# Patient Record
Sex: Female | Born: 1975 | Race: White | Hispanic: No | Marital: Married | Smoking: Never smoker
Health system: Southern US, Community
[De-identification: ages and names within clinical notes are randomized; demographics above are authoritative.]

## PROBLEM LIST (undated history)

## (undated) DIAGNOSIS — D250 Submucous leiomyoma of uterus: Secondary | ICD-10-CM

## (undated) DIAGNOSIS — L03116 Cellulitis of left lower limb: Secondary | ICD-10-CM

## (undated) DIAGNOSIS — H02889 Meibomian gland dysfunction of unspecified eye, unspecified eyelid: Secondary | ICD-10-CM

## (undated) DIAGNOSIS — Z8709 Personal history of other diseases of the respiratory system: Secondary | ICD-10-CM

## (undated) DIAGNOSIS — J9811 Atelectasis: Secondary | ICD-10-CM

## (undated) DIAGNOSIS — R112 Nausea with vomiting, unspecified: Secondary | ICD-10-CM

## (undated) DIAGNOSIS — F419 Anxiety disorder, unspecified: Secondary | ICD-10-CM

## (undated) DIAGNOSIS — D509 Iron deficiency anemia, unspecified: Secondary | ICD-10-CM

## (undated) DIAGNOSIS — Z9889 Other specified postprocedural states: Secondary | ICD-10-CM

## (undated) DIAGNOSIS — Z87442 Personal history of urinary calculi: Secondary | ICD-10-CM

## (undated) DIAGNOSIS — N61 Mastitis without abscess: Secondary | ICD-10-CM

## (undated) DIAGNOSIS — C50919 Malignant neoplasm of unspecified site of unspecified female breast: Secondary | ICD-10-CM

## (undated) DIAGNOSIS — Z1379 Encounter for other screening for genetic and chromosomal anomalies: Principal | ICD-10-CM

## (undated) DIAGNOSIS — T7840XA Allergy, unspecified, initial encounter: Secondary | ICD-10-CM

## (undated) DIAGNOSIS — R221 Localized swelling, mass and lump, neck: Secondary | ICD-10-CM

## (undated) DIAGNOSIS — Z8489 Family history of other specified conditions: Secondary | ICD-10-CM

## (undated) DIAGNOSIS — G43909 Migraine, unspecified, not intractable, without status migrainosus: Secondary | ICD-10-CM

## (undated) DIAGNOSIS — I959 Hypotension, unspecified: Secondary | ICD-10-CM

## (undated) DIAGNOSIS — G709 Myoneural disorder, unspecified: Secondary | ICD-10-CM

## (undated) HISTORY — DX: Meibomian gland dysfunction of unspecified eye, unspecified eyelid: H02.889

## (undated) HISTORY — PX: DILATION AND CURETTAGE OF UTERUS: SHX78

## (undated) HISTORY — PX: TONSILLECTOMY: SUR1361

## (undated) HISTORY — PX: BREAST CYST ASPIRATION: SHX578

## (undated) HISTORY — DX: Encounter for other screening for genetic and chromosomal anomalies: Z13.79

---

## 2005-07-29 ENCOUNTER — Ambulatory Visit (HOSPITAL_COMMUNITY): Admission: RE | Admit: 2005-07-29 | Discharge: 2005-07-29 | Payer: Self-pay | Admitting: *Deleted

## 2005-07-29 ENCOUNTER — Encounter (INDEPENDENT_AMBULATORY_CARE_PROVIDER_SITE_OTHER): Payer: Self-pay | Admitting: Specialist

## 2005-09-07 ENCOUNTER — Encounter: Admission: RE | Admit: 2005-09-07 | Discharge: 2005-09-07 | Payer: Self-pay | Admitting: *Deleted

## 2005-09-10 ENCOUNTER — Encounter: Admission: RE | Admit: 2005-09-10 | Discharge: 2005-09-10 | Payer: Self-pay | Admitting: *Deleted

## 2005-09-10 ENCOUNTER — Encounter (INDEPENDENT_AMBULATORY_CARE_PROVIDER_SITE_OTHER): Payer: Self-pay | Admitting: *Deleted

## 2006-08-06 ENCOUNTER — Emergency Department (HOSPITAL_COMMUNITY): Admission: EM | Admit: 2006-08-06 | Discharge: 2006-08-06 | Payer: Self-pay | Admitting: Emergency Medicine

## 2006-10-15 ENCOUNTER — Inpatient Hospital Stay (HOSPITAL_COMMUNITY): Admission: RE | Admit: 2006-10-15 | Discharge: 2006-10-17 | Payer: Self-pay | Admitting: *Deleted

## 2007-04-26 ENCOUNTER — Inpatient Hospital Stay (HOSPITAL_COMMUNITY): Admission: AD | Admit: 2007-04-26 | Discharge: 2007-04-26 | Payer: Self-pay | Admitting: Obstetrics & Gynecology

## 2008-08-28 IMAGING — US US TRANSVAGINAL NON-OB
1 series · 13 of 25 positions shown · non-contrast
Comparison: None.

RENAL/URINARY TRACT ULTRASOUND:

CLINICAL DATA: Back pain. Dysuria.
TECHNIQUE: Complete ultrasound examination of the urinary tract was performed
including evaluation of the kidneys, renal collecting systems, and urinary
bladder.
TECHNIQUE: Both transabdominal and transvaginal ultrasound examinations of the
pelvis were performed including evaluation of the uterus, ovaries, adnexal
regions, and pelvic cul-de-sac.

[Series 1: us transvaginal non-ob · 0.26mm/px · 13 of 32 slices shown]
[im 1/32]
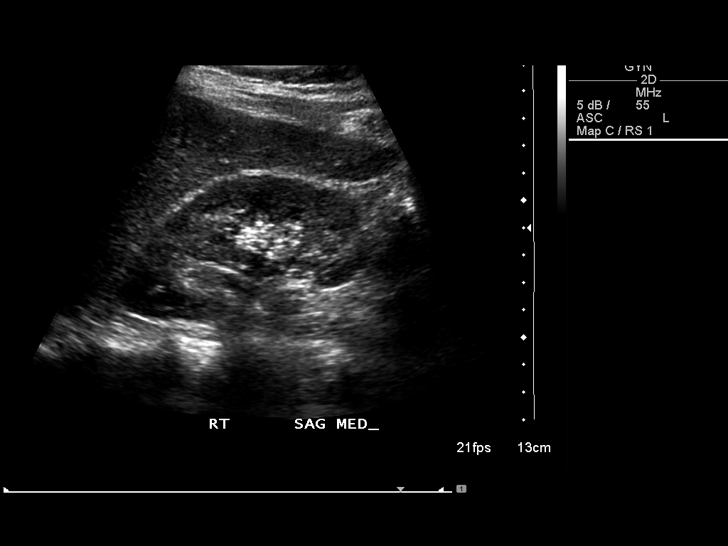
[im 3/32]
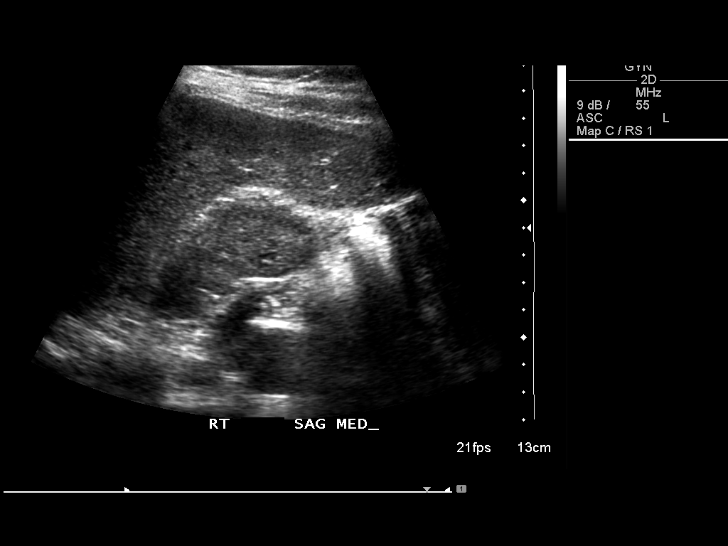
[im 6/32]
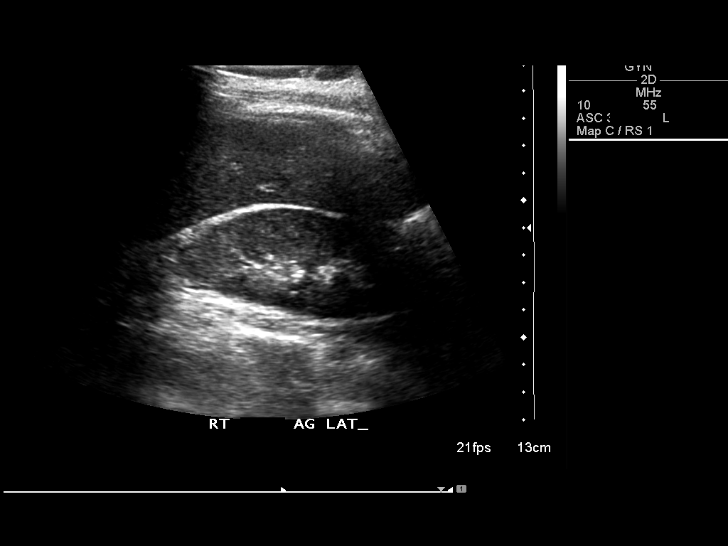
[im 8/32]
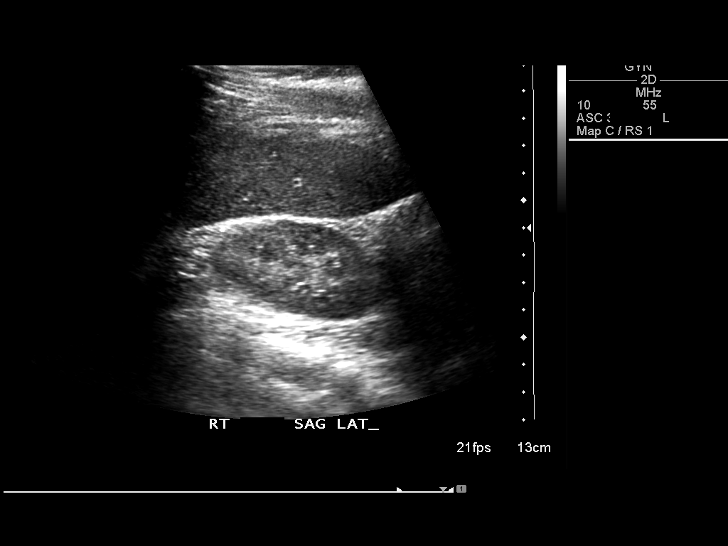
[im 11/32]
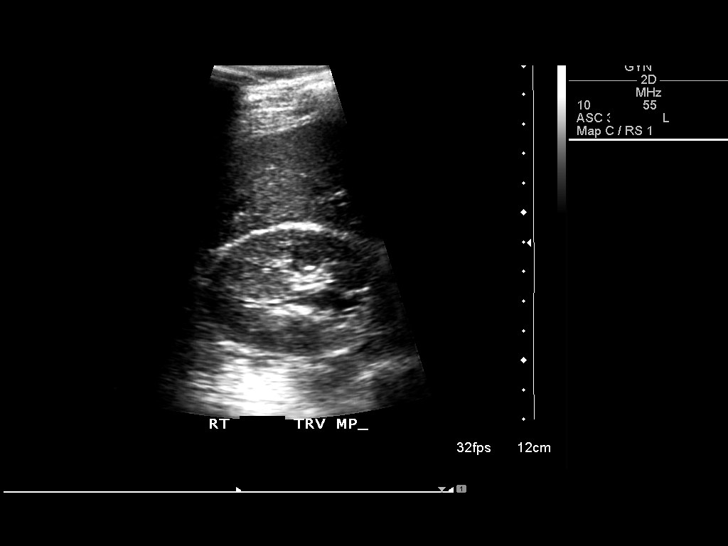
[im 13/32]
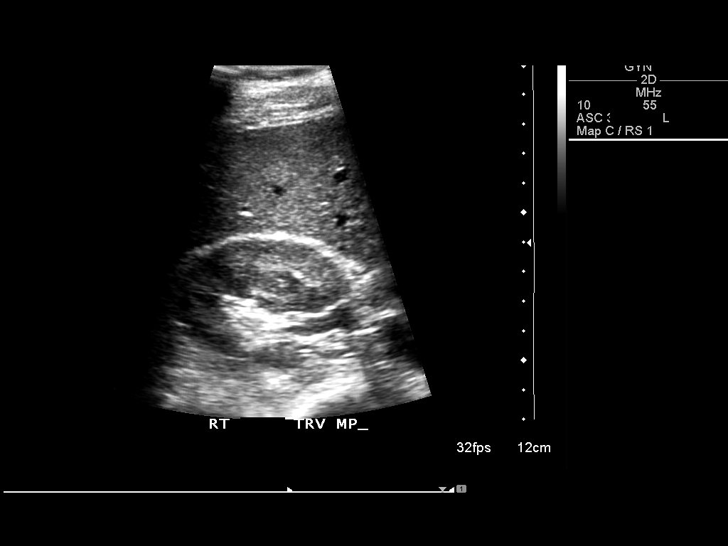
[im 16/32]
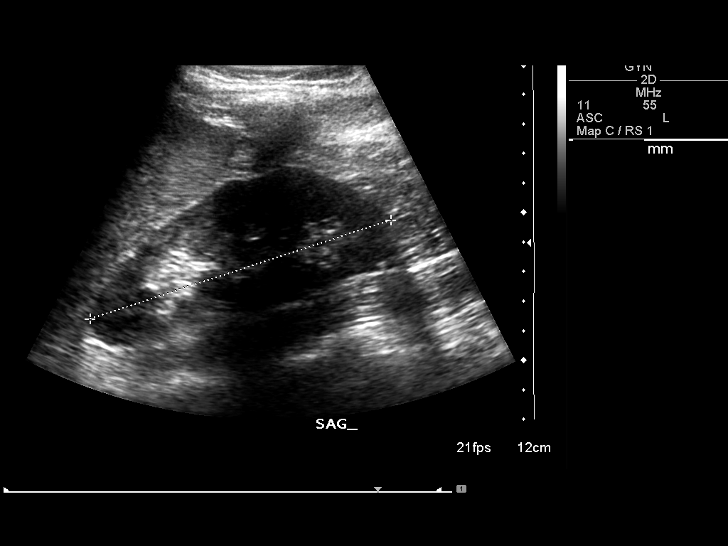
[im 19/32]
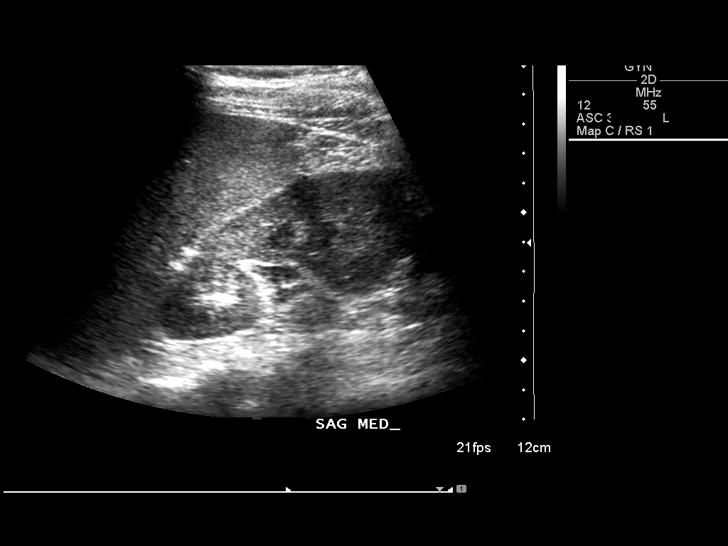
[im 21/32]
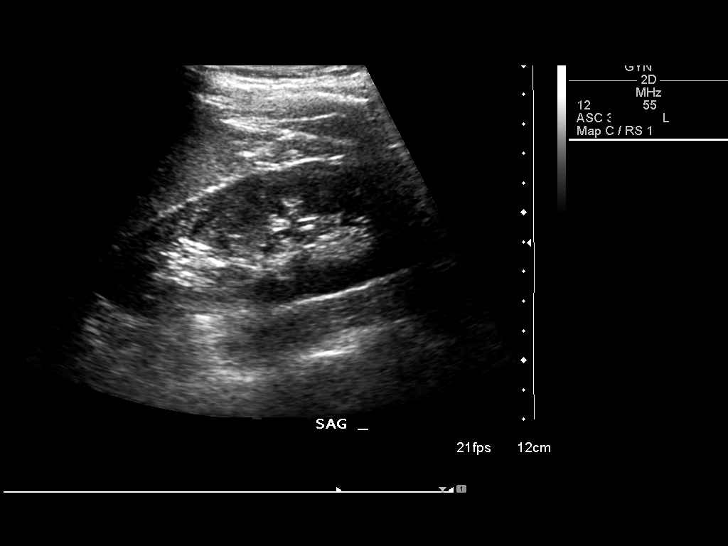
[im 24/32]
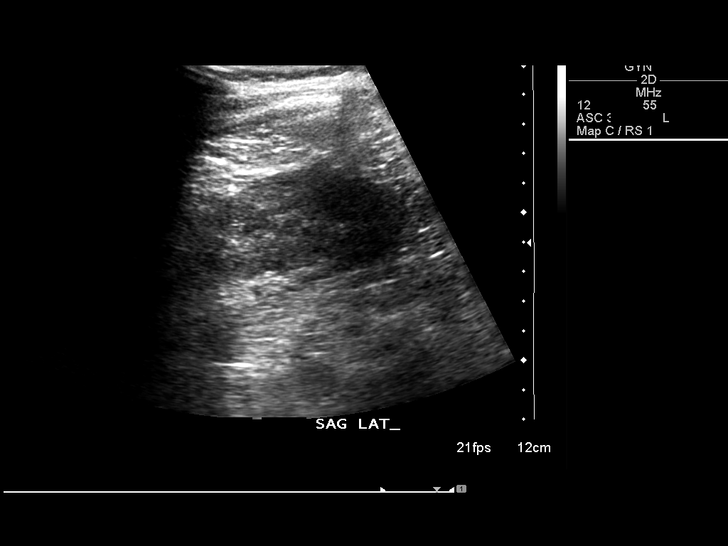
[im 26/32]
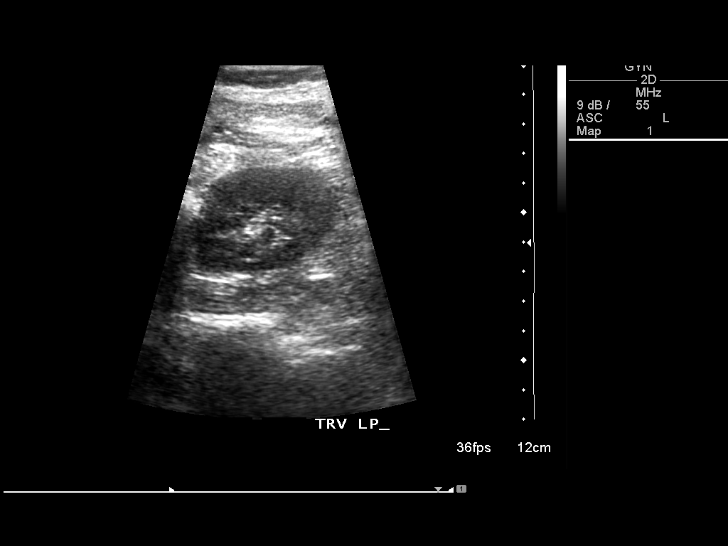
[im 29/32]
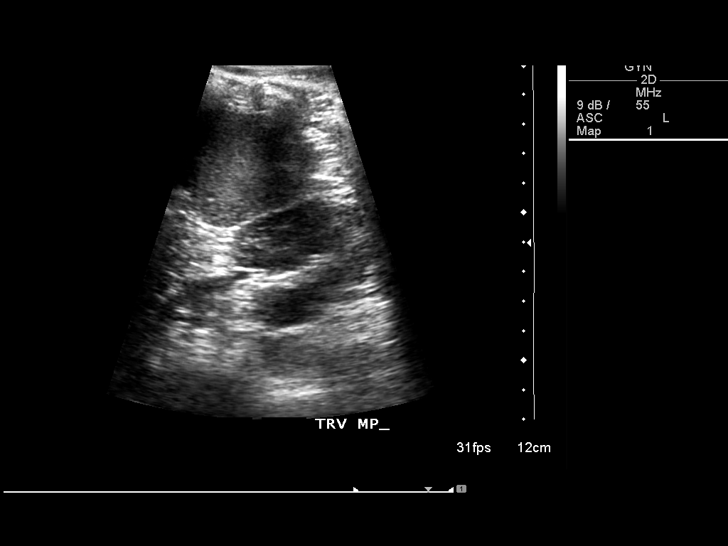
[im 32/32]
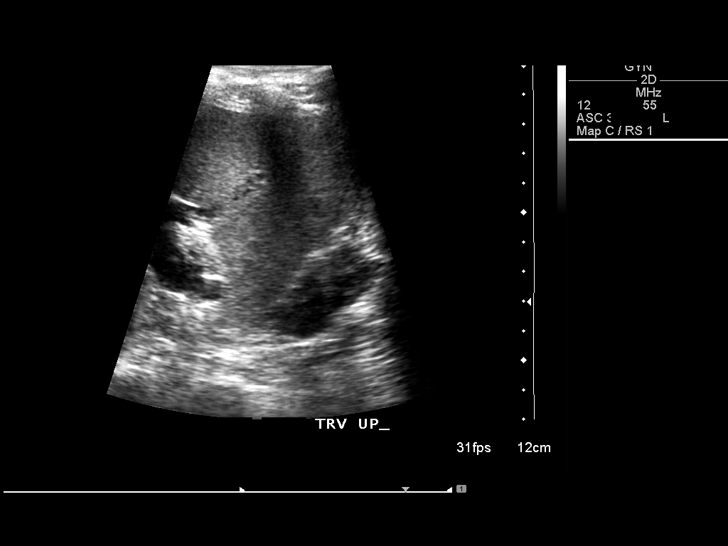

[13 of 25 positions shown; findings below may reference images not displayed]

FINDINGS: The right kidney measures 10.5 cm in long axis. The left kidney
measures 10.8 cm. Both kidneys are sonographically normal. No renal stones are
identified.

Midline imaging in the anatomic pelvis reveals a normal urinary bladder.
Bilateral ureteral jets are apparent on color Doppler imaging.
IMPRESSION: Normal renal/urinary tract ultrasound. 

TRANSABDOMINAL AND TRANSVAGINAL PELVIC ULTRASOUND:
FINDINGS: The uterus measures 6.7 x 3.2 x 4.7 cm.  Myometrial echotexture is
homogeneous. No evidence for fibroids.  Endometrial stripe thickness is 3 mm.
IUD is identified within the endometrial cavity.

The right ovary measures 3.3 x 1.8 x 1.5 cm.  The left ovary measures 3.0 x
x 2.0 cm.  Both ovaries are sonographically normal.  There is no free fluid in
the cul-de-sac.
IMPRESSION: IUD. Otherwise normal exam.

## 2008-08-28 IMAGING — US US TRANSVAGINAL NON-OB
1 series · 13 of 25 positions shown · non-contrast
Comparison: None.

RENAL/URINARY TRACT ULTRASOUND:

CLINICAL DATA: Back pain. Dysuria.
TECHNIQUE: Complete ultrasound examination of the urinary tract was performed
including evaluation of the kidneys, renal collecting systems, and urinary
bladder.
TECHNIQUE: Both transabdominal and transvaginal ultrasound examinations of the
pelvis were performed including evaluation of the uterus, ovaries, adnexal
regions, and pelvic cul-de-sac.

[Series 1: us transvaginal non-ob · 0.16mm/px · 27 acquisitions, 13 frames shown]
[im 1/27]
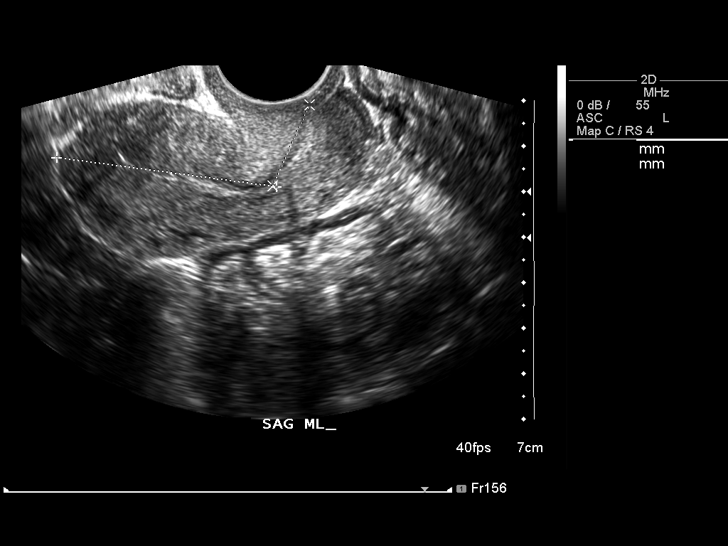
[im 3/27]
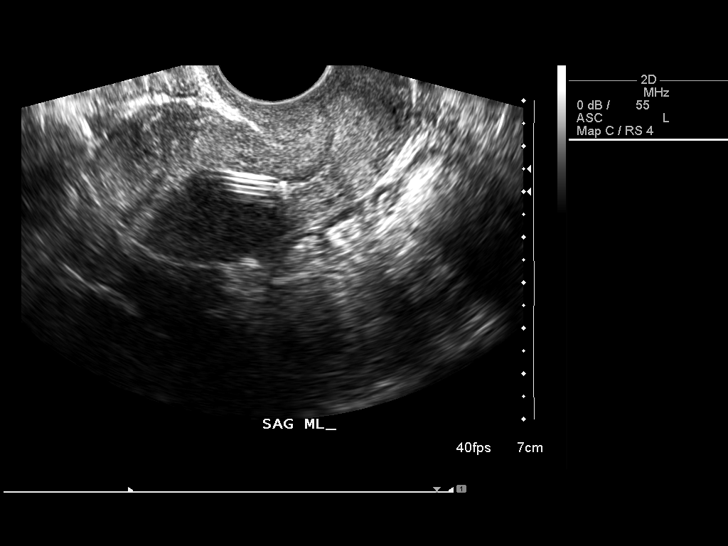
[im 5/27]
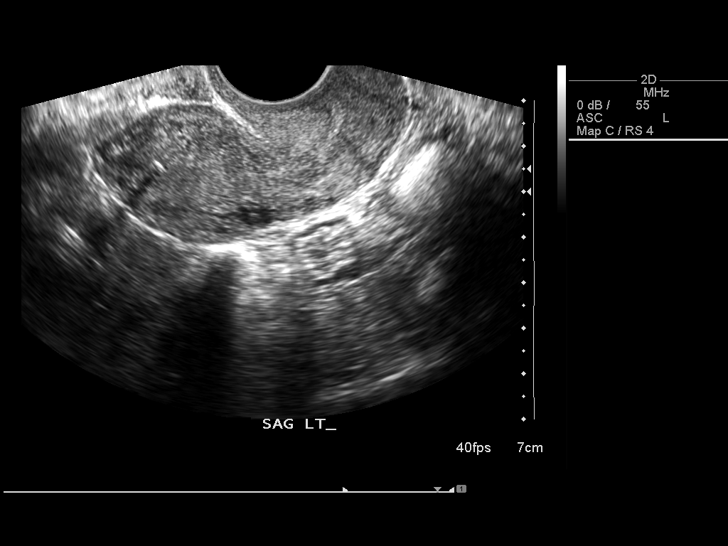
[im 7/27]
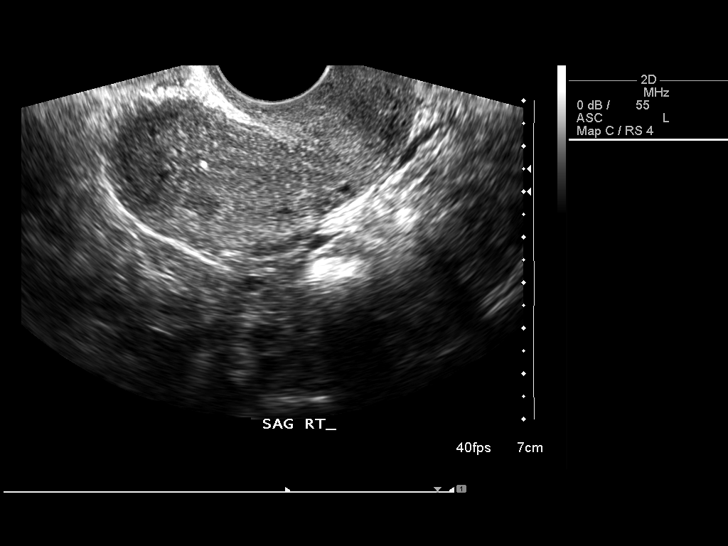
[im 9/27]
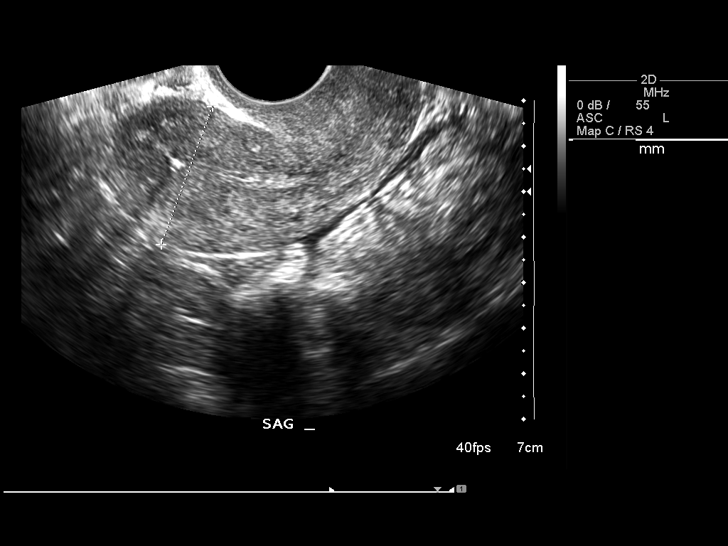
[im 11/27]
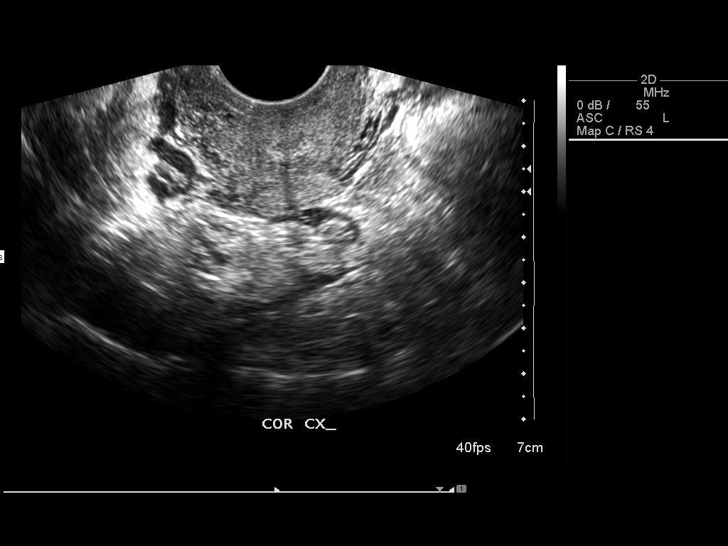
[im 14/27]
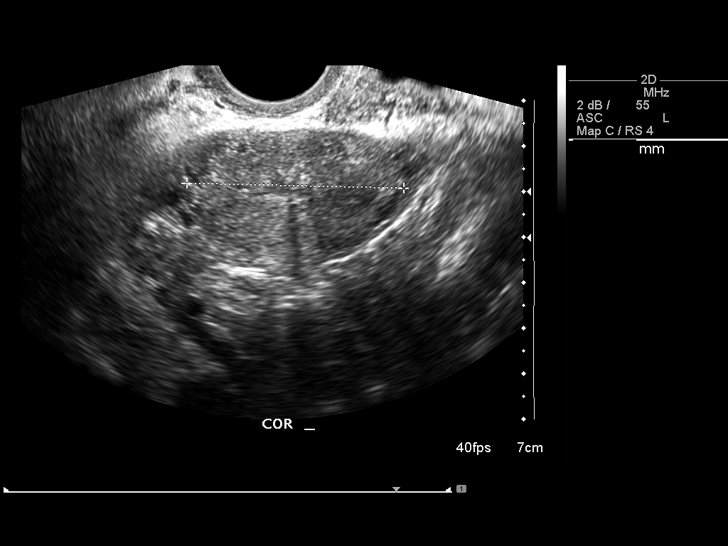
[im 16/27]
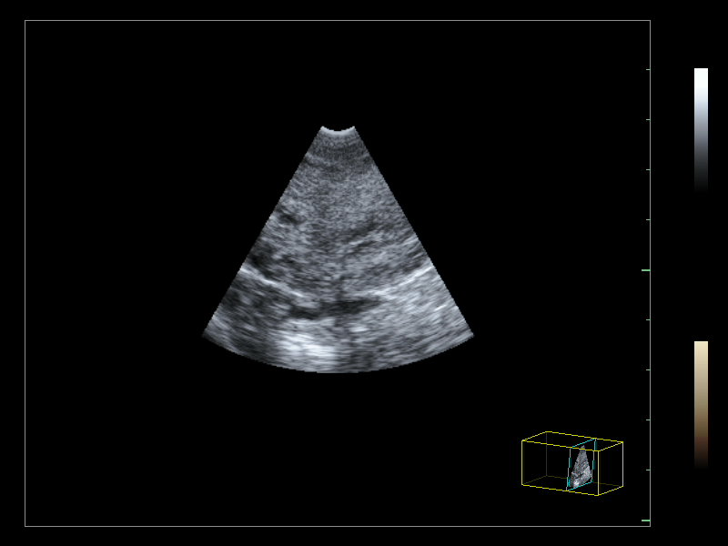
[im 18/27]
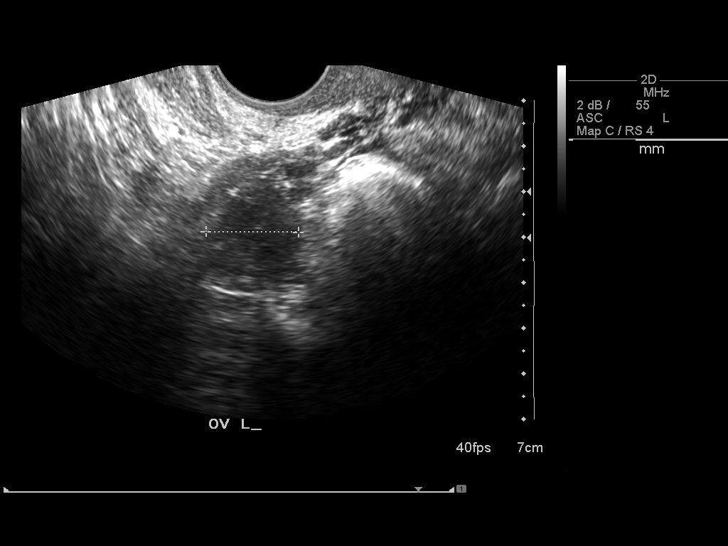
[im 20/27]
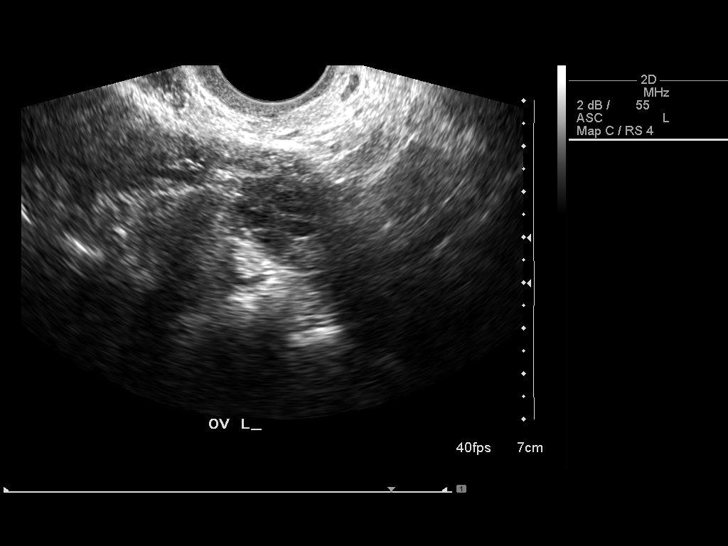
[im 22/27]
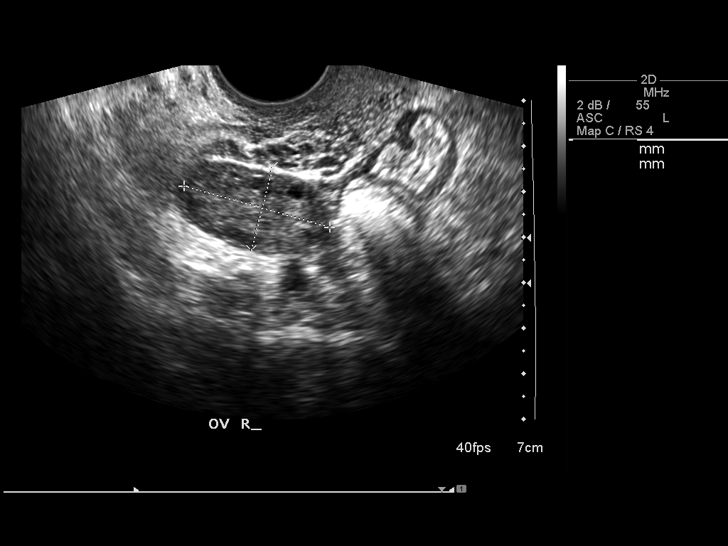
[im 24/27]
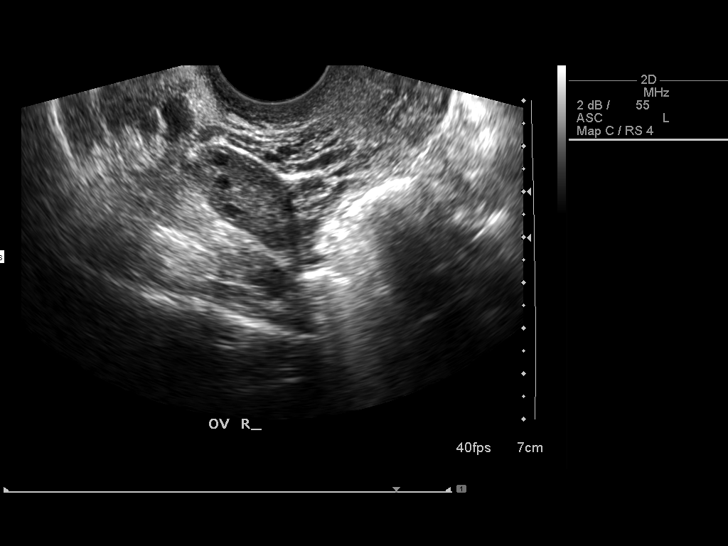
[im 27/27]
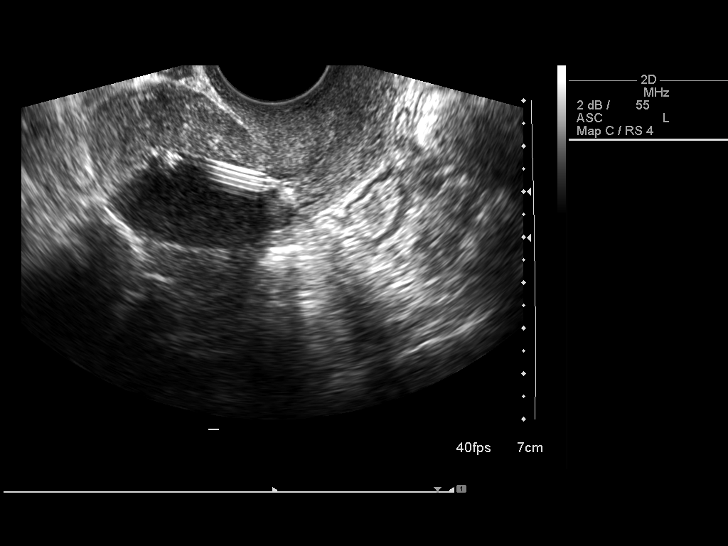

[13 of 25 positions shown; findings below may reference images not displayed]

FINDINGS: The right kidney measures 10.5 cm in long axis. The left kidney
measures 10.8 cm. Both kidneys are sonographically normal. No renal stones are
identified.

Midline imaging in the anatomic pelvis reveals a normal urinary bladder.
Bilateral ureteral jets are apparent on color Doppler imaging.
IMPRESSION: Normal renal/urinary tract ultrasound. 

TRANSABDOMINAL AND TRANSVAGINAL PELVIC ULTRASOUND:
FINDINGS: The uterus measures 6.7 x 3.2 x 4.7 cm.  Myometrial echotexture is
homogeneous. No evidence for fibroids.  Endometrial stripe thickness is 3 mm.
IUD is identified within the endometrial cavity.

The right ovary measures 3.3 x 1.8 x 1.5 cm.  The left ovary measures 3.0 x
x 2.0 cm.  Both ovaries are sonographically normal.  There is no free fluid in
the cul-de-sac.
IMPRESSION: IUD. Otherwise normal exam.

## 2010-11-18 ENCOUNTER — Encounter
Admission: RE | Admit: 2010-11-18 | Discharge: 2010-11-18 | Payer: Self-pay | Source: Home / Self Care | Attending: Obstetrics & Gynecology | Admitting: Obstetrics & Gynecology

## 2010-12-01 ENCOUNTER — Encounter
Admission: RE | Admit: 2010-12-01 | Discharge: 2010-12-01 | Payer: Self-pay | Source: Home / Self Care | Attending: Obstetrics & Gynecology | Admitting: Obstetrics & Gynecology

## 2011-04-10 NOTE — Op Note (Signed)
NAMEALAZAE, CRYMES           ACCOUNT NO.:  [id number]   MEDICAL RECORD NO.:  [id number]          PATIENT TYPE:  AMB   LOCATION:  [facility]                           FACILITY:  [facility]   PHYSICIAN:  [place] B. Earlene Plater, M.D.  DATE OF BIRTH:  Mar 23, 1976   DATE OF PROCEDURE:  07/29/2005  DATE OF DISCHARGE:                                 OPERATIVE REPORT   PREOPERATIVE DIAGNOSIS:  An 8-week missed abortion.   POSTOPERATIVE DIAGNOSIS:  An 8-week missed abortion.   PROCEDURE:  Suction curettage.   SURGEON:  Chester Holstein. Earlene Plater, M.D.   ASSISTANTS:  None.   ANESTHESIA:  MAC and 20 cubic centimeters of 1% lidocaine paracervical  block.   SPECIMENS:  Products of conception.   BLOOD LOSS:  Minimal.   COMPLICATIONS:  None.   INDICATIONS:  The patient presented to the office with 3-4 day history of  light vaginal bleeding without pain.  Ultrasound confirmed an 8 week demise.  Options of expectant management versus operative management were discussed.  Risks of surgery were discussed including infection, bleeding, uterine  perforation, and damage to surrounding organs.   On the morning of surgery, patient did note some increase in bleeding and  cramping and did pass some tissue although her bleeding continued and  patient stated it was quite heavy in the preoperative area.  I therefore  decided to proceed this morning with suction and curettage.   PROCEDURE:  The patient was taken to the operating room and MAC anesthesia  obtained.  She was placed in the [place] stirrups and prepped and draped in  the standard fashion.  Bladder was emptied with in and out catheterization.  Exam under anesthesia showed an 8 week anteverted uterus, no adnexal masses.   Speculum was inserted, paracervical block placed.  A single-toothed  tenaculum was attached to the anterior lip of the cervix.  The cervix was  easily dilated to a #21 and the #7 suction cannula inserted.  It was  suctioned with only a small amount  of products returned.  The uterus was  cleared with suction.  The endometrium was then gently curetted and a gritty  texture noted throughout with no additional tissue return.  Suction cannula  was passed one more time and no products returned.  Therefore, the procedure  was terminated.   Instruments were removed and cervix hemostatic.   The patient tolerated the procedure well and there were no known  complications.  She was taken to the recovery room in awake, alert, and  stable condition.      Gerri Spore B. Earlene Plater, M.D.  Electronically Signed     WBD/MEDQ  D:  07/29/2005  T:  07/29/2005  Job:  [id number]

## 2011-09-10 LAB — URINALYSIS, ROUTINE W REFLEX MICROSCOPIC
Bilirubin Urine: NEGATIVE
Glucose, UA: NEGATIVE
Ketones, ur: NEGATIVE
Nitrite: NEGATIVE
Protein, ur: NEGATIVE
Specific Gravity, Urine: <1.005 — ABNORMAL LOW
Urobilinogen, UA: 0.2
pH: 7

## 2011-09-10 LAB — DIFFERENTIAL
Basophils Absolute: 0
Basophils Relative: 0
Eosinophils Absolute: 0
Eosinophils Relative: 0
Lymphocytes Relative: 12
Lymphs Abs: 1.6
Monocytes Absolute: 0.7
Monocytes Relative: 5
Neutro Abs: 11.7 — ABNORMAL HIGH
Neutrophils Relative %: 83 — ABNORMAL HIGH

## 2011-09-10 LAB — URINE MICROSCOPIC-ADD ON

## 2011-09-10 LAB — URINE CULTURE: Colony Count: >=100000

## 2011-09-10 LAB — CBC
HCT: 41.9
Hemoglobin: 14.1
MCHC: 33.6
MCV: 86.3
Platelets: 214
RBC: 4.86
RDW: 14.6 — ABNORMAL HIGH
WBC: 14 — ABNORMAL HIGH

## 2011-09-10 LAB — POCT PREGNANCY, URINE
Operator id: 120561
Preg Test, Ur: NEGATIVE

## 2012-04-23 DIAGNOSIS — L03116 Cellulitis of left lower limb: Secondary | ICD-10-CM

## 2012-04-23 HISTORY — DX: Cellulitis of left lower limb: L03.116

## 2013-08-23 DIAGNOSIS — Z8709 Personal history of other diseases of the respiratory system: Secondary | ICD-10-CM

## 2013-08-23 HISTORY — DX: Personal history of other diseases of the respiratory system: Z87.09

## 2014-05-14 ENCOUNTER — Other Ambulatory Visit: Payer: Self-pay

## 2014-05-14 DIAGNOSIS — Z1231 Encounter for screening mammogram for malignant neoplasm of breast: Secondary | ICD-10-CM

## 2014-06-26 ENCOUNTER — Ambulatory Visit
Admission: RE | Admit: 2014-06-26 | Discharge: 2014-06-26 | Disposition: A | Discharge status: Home / Self Care | Payer: BC Managed Care – PPO | Source: Ambulatory Visit

## 2014-06-26 ENCOUNTER — Encounter (INDEPENDENT_AMBULATORY_CARE_PROVIDER_SITE_OTHER): Payer: Self-pay

## 2014-06-26 DIAGNOSIS — Z1231 Encounter for screening mammogram for malignant neoplasm of breast: Secondary | ICD-10-CM

## 2015-05-22 ENCOUNTER — Other Ambulatory Visit: Payer: Self-pay

## 2015-05-22 DIAGNOSIS — Z1231 Encounter for screening mammogram for malignant neoplasm of breast: Secondary | ICD-10-CM

## 2015-06-28 ENCOUNTER — Ambulatory Visit
Admission: RE | Admit: 2015-06-28 | Discharge: 2015-06-28 | Disposition: A | Discharge status: Home / Self Care | Payer: BLUE CROSS/BLUE SHIELD | Source: Ambulatory Visit

## 2015-06-28 DIAGNOSIS — Z1231 Encounter for screening mammogram for malignant neoplasm of breast: Secondary | ICD-10-CM

## 2016-09-29 ENCOUNTER — Other Ambulatory Visit: Payer: Self-pay | Admitting: Obstetrics & Gynecology

## 2016-09-29 DIAGNOSIS — Z1231 Encounter for screening mammogram for malignant neoplasm of breast: Secondary | ICD-10-CM

## 2016-10-30 ENCOUNTER — Ambulatory Visit
Admission: RE | Admit: 2016-10-30 | Discharge: 2016-10-30 | Disposition: A | Discharge status: Home / Self Care | Payer: BLUE CROSS/BLUE SHIELD | Source: Ambulatory Visit | Attending: Obstetrics & Gynecology | Admitting: Obstetrics & Gynecology

## 2016-10-30 DIAGNOSIS — Z1231 Encounter for screening mammogram for malignant neoplasm of breast: Secondary | ICD-10-CM

## 2016-11-04 ENCOUNTER — Other Ambulatory Visit: Payer: Self-pay | Admitting: Obstetrics & Gynecology

## 2016-11-04 DIAGNOSIS — R928 Other abnormal and inconclusive findings on diagnostic imaging of breast: Secondary | ICD-10-CM

## 2016-11-11 ENCOUNTER — Ambulatory Visit
Admission: RE | Admit: 2016-11-11 | Discharge: 2016-11-11 | Disposition: A | Discharge status: Home / Self Care | Payer: BLUE CROSS/BLUE SHIELD | Source: Ambulatory Visit | Attending: Obstetrics & Gynecology | Admitting: Obstetrics & Gynecology

## 2016-11-11 DIAGNOSIS — R928 Other abnormal and inconclusive findings on diagnostic imaging of breast: Secondary | ICD-10-CM

## 2017-03-24 ENCOUNTER — Encounter: Payer: Self-pay | Admitting: Obstetrics & Gynecology

## 2017-03-31 ENCOUNTER — Ambulatory Visit (INDEPENDENT_AMBULATORY_CARE_PROVIDER_SITE_OTHER): Payer: BLUE CROSS/BLUE SHIELD | Admitting: Obstetrics & Gynecology

## 2017-03-31 ENCOUNTER — Telehealth: Payer: Self-pay | Admitting: *Deleted

## 2017-03-31 ENCOUNTER — Encounter: Payer: Self-pay | Admitting: Obstetrics & Gynecology

## 2017-03-31 VITALS — BP 126/80 | Ht 66.0 in | Wt 145.0 lb

## 2017-03-31 DIAGNOSIS — N63 Unspecified lump in unspecified breast: Secondary | ICD-10-CM | POA: Diagnosis not present

## 2017-03-31 DIAGNOSIS — Z01411 Encounter for gynecological examination (general) (routine) with abnormal findings: Secondary | ICD-10-CM | POA: Diagnosis not present

## 2017-03-31 DIAGNOSIS — Z9852 Vasectomy status: Secondary | ICD-10-CM

## 2017-03-31 NOTE — Progress Notes (Signed)
Carla Velez November 21, 1976 [id number]   History:    41 y.o. G2P2 married.  2 girls 43 and 43 yo doing well.  Vasectomy.  RP:  Established patient presenting for annual gyn exam   Menses reg normal qmonth, normal flow.  No pelvic pain.  No vaginal d/c.  Breasts wnl.  Had a Rt Dx mammo/US last year which came back negative.  Mother with h/o Breast Ca.  Past medical history,surgical history, family history and social history were all reviewed and documented in the EPIC chart.  Gynecologic History Patient's last menstrual period was 03/21/2017. Contraception: vasectomy Last Pap: 2017. Results were: normal per patient Last mammogram: 10/2016. Results were:  Lt screening normal. Rt Dx mammo/US neg.  Obstetric History OB History  Gravida Para Term Preterm AB Living  2 1   1 1 2   SAB TAB Ectopic Multiple Live Births      0        # Outcome Date GA Lbr Len/2nd Weight Sex Delivery Anes PTL Lv  2 Preterm           1 AB                ROS: A ROS was performed and pertinent positives and negatives are included in the history.  GENERAL: No fevers or chills. HEENT: No change in vision, no earache, sore throat or sinus congestion. NECK: No pain or stiffness. CARDIOVASCULAR: No chest pain or pressure. No palpitations. PULMONARY: No shortness of breath, cough or wheeze. GASTROINTESTINAL: No abdominal pain, nausea, vomiting or diarrhea, melena or bright red blood per rectum. GENITOURINARY: No urinary frequency, urgency, hesitancy or dysuria. MUSCULOSKELETAL: No joint or muscle pain, no back pain, no recent trauma. DERMATOLOGIC: No rash, no itching, no lesions. ENDOCRINE: No polyuria, polydipsia, no heat or cold intolerance. No recent change in weight. HEMATOLOGICAL: No anemia or easy bruising or bleeding. NEUROLOGIC: No headache, seizures, numbness, tingling or weakness. PSYCHIATRIC: No depression, no loss of interest in normal activity or change in sleep pattern.     Exam:   BP 126/80   Ht  5\' 6"  (1.676 m)   Wt 145 lb (65.8 kg)   LMP 03/21/2017   BMI 23.40 kg/m   Body mass index is 23.4 kg/m.  General appearance : Well developed well nourished female. No acute distress HEENT: Eyes: no retinal hemorrhage or exudates,  Neck supple, trachea midline, no carotid bruits, no thyroidmegaly Lungs: Clear to auscultation, no rhonchi or wheezes, or rib retractions  Heart: Regular rate and rhythm, no murmurs or gallops Breast:Examined in sitting and supine position were symmetrical in appearance.  Left: no palpable masses or tenderness,  no skin retraction, no nipple inversion, no nipple discharge, no skin discoloration.  Right: 1 cm x 1 cm nodule at 1 O'clock close to [place].  Mobile, not tender, skin normal.  No axillary or supraclavicular lymphadenopathy bilaterally. Abdomen: no palpable masses or tenderness, no rebound or guarding Extremities: no edema or skin discoloration or tenderness  Pelvic:  [place], Urethra, Skene Glands: Within normal limits             Vagina: No gross lesions or discharge  Cervix: No gross lesions or discharge.  Pap/HPV done.  Uterus  AV, normal size, shape and consistency, non-tender and mobile  Adnexa  Without masses or tenderness  Anus and perineum  normal    Assessment/Plan:  41 y.o. female for annual exam   1. Encounter for gynecological examination with abnormal finding Normal gyn  exam except Rt breast nodule.  Pap/HPV done.   2. Breast nodule Rt breast nodule at 1 O'clock.  Rt Dx Mammo/US at Breast center.  Mother with Breast Ca.  Findings and plan discussed with patient.  3. Vasectomy status/Husband  Counseling >50% 10 min on above issue.  [name] MD, 10:19 AM 03/31/2017

## 2017-03-31 NOTE — Telephone Encounter (Signed)
Pt scheduled on 04/05/17 @ 2:30 pm at breast center,pt aware.

## 2017-03-31 NOTE — Patient Instructions (Signed)
1. Encounter for gynecological examination with abnormal finding Normal gyn exam except Rt breast nodule.  Pap/HPV done.   2. Breast nodule Rt breast nodule at 1 O'clock.  Rt Dx Mammo/US at Breast center.  Mother with Breast Ca.  Findings and plan discussed with patient.  3. Vasectomy status/Husband  Giuliana, it was a pleasure to see you today!  You will receive a phone call to schedule your Diagnostic right mammo and breast US.

## 2017-03-31 NOTE — Telephone Encounter (Signed)
-----   Message from [name], MD sent at 03/31/2017 10:52 AM EDT ----- Regarding: Rt Dx mammo/Breast US Rt breast nodule at 1 O'clock close to [place] 1 x 1 cm mobile, solid, non-tender.  Mother with Breast Ca.

## 2017-04-02 LAB — PAP, TP IMAGING W/ HPV RNA, RFLX HPV TYPE 16,18/45: HPV mRNA, High Risk: NOT DETECTED

## 2017-04-05 ENCOUNTER — Ambulatory Visit
Admission: RE | Admit: 2017-04-05 | Discharge: 2017-04-05 | Disposition: A | Discharge status: Home / Self Care | Payer: BLUE CROSS/BLUE SHIELD | Source: Ambulatory Visit | Attending: Obstetrics & Gynecology | Admitting: Obstetrics & Gynecology

## 2017-04-05 ENCOUNTER — Other Ambulatory Visit: Payer: Self-pay | Admitting: Obstetrics & Gynecology

## 2017-04-05 DIAGNOSIS — N63 Unspecified lump in unspecified breast: Secondary | ICD-10-CM

## 2017-04-05 DIAGNOSIS — N6310 Unspecified lump in the right breast, unspecified quadrant: Secondary | ICD-10-CM

## 2017-04-07 ENCOUNTER — Ambulatory Visit
Admission: RE | Admit: 2017-04-07 | Discharge: 2017-04-07 | Disposition: A | Discharge status: Home / Self Care | Payer: BLUE CROSS/BLUE SHIELD | Source: Ambulatory Visit | Attending: Obstetrics & Gynecology | Admitting: Obstetrics & Gynecology

## 2017-04-07 ENCOUNTER — Other Ambulatory Visit: Payer: Self-pay | Admitting: Obstetrics & Gynecology

## 2017-04-07 DIAGNOSIS — C50919 Malignant neoplasm of unspecified site of unspecified female breast: Secondary | ICD-10-CM

## 2017-04-07 DIAGNOSIS — N6310 Unspecified lump in the right breast, unspecified quadrant: Secondary | ICD-10-CM

## 2017-04-07 HISTORY — DX: Malignant neoplasm of unspecified site of unspecified female breast: C50.919

## 2017-04-08 ENCOUNTER — Telehealth: Payer: Self-pay | Admitting: *Deleted

## 2017-04-08 NOTE — Telephone Encounter (Signed)
Confirmed [facility] for 04/14/17 at 815am .  Instructions and contact information given.

## 2017-04-13 ENCOUNTER — Other Ambulatory Visit: Payer: Self-pay | Admitting: *Deleted

## 2017-04-13 DIAGNOSIS — Z170 Estrogen receptor positive status [ER+]: Principal | ICD-10-CM

## 2017-04-13 DIAGNOSIS — C50211 Malignant neoplasm of upper-inner quadrant of right female breast: Secondary | ICD-10-CM | POA: Insufficient documentation

## 2017-04-14 ENCOUNTER — Encounter: Payer: Self-pay | Admitting: Hematology and Oncology

## 2017-04-14 ENCOUNTER — Encounter: Payer: Self-pay | Admitting: Physical Therapy

## 2017-04-14 ENCOUNTER — Encounter: Payer: Self-pay | Admitting: *Deleted

## 2017-04-14 ENCOUNTER — Ambulatory Visit (HOSPITAL_BASED_OUTPATIENT_CLINIC_OR_DEPARTMENT_OTHER): Payer: BLUE CROSS/BLUE SHIELD | Admitting: Hematology and Oncology

## 2017-04-14 ENCOUNTER — Ambulatory Visit: Payer: Self-pay | Admitting: Surgery

## 2017-04-14 ENCOUNTER — Encounter: Payer: Self-pay | Admitting: Radiation Oncology

## 2017-04-14 ENCOUNTER — Ambulatory Visit
Admission: RE | Admit: 2017-04-14 | Discharge: 2017-04-14 | Disposition: A | Discharge status: Home / Self Care | Payer: BLUE CROSS/BLUE SHIELD | Source: Ambulatory Visit | Attending: Radiation Oncology | Admitting: Radiation Oncology

## 2017-04-14 ENCOUNTER — Other Ambulatory Visit (HOSPITAL_BASED_OUTPATIENT_CLINIC_OR_DEPARTMENT_OTHER): Payer: BLUE CROSS/BLUE SHIELD

## 2017-04-14 ENCOUNTER — Ambulatory Visit: Payer: BLUE CROSS/BLUE SHIELD | Attending: Surgery | Admitting: Physical Therapy

## 2017-04-14 DIAGNOSIS — Z170 Estrogen receptor positive status [ER+]: Principal | ICD-10-CM

## 2017-04-14 DIAGNOSIS — C50211 Malignant neoplasm of upper-inner quadrant of right female breast: Secondary | ICD-10-CM

## 2017-04-14 DIAGNOSIS — F419 Anxiety disorder, unspecified: Secondary | ICD-10-CM | POA: Insufficient documentation

## 2017-04-14 DIAGNOSIS — R293 Abnormal posture: Secondary | ICD-10-CM | POA: Diagnosis present

## 2017-04-14 DIAGNOSIS — G709 Myoneural disorder, unspecified: Secondary | ICD-10-CM | POA: Insufficient documentation

## 2017-04-14 DIAGNOSIS — Z87442 Personal history of urinary calculi: Secondary | ICD-10-CM | POA: Insufficient documentation

## 2017-04-14 DIAGNOSIS — Z9889 Other specified postprocedural states: Secondary | ICD-10-CM | POA: Insufficient documentation

## 2017-04-14 DIAGNOSIS — C50411 Malignant neoplasm of upper-outer quadrant of right female breast: Secondary | ICD-10-CM

## 2017-04-14 DIAGNOSIS — Z803 Family history of malignant neoplasm of breast: Secondary | ICD-10-CM

## 2017-04-14 LAB — COMPREHENSIVE METABOLIC PANEL
ALBUMIN: 4.1 g/dL (ref 3.5–5.0)
ALK PHOS: 65 U/L (ref 40–150)
ALT: 14 U/L (ref 0–55)
AST: 17 U/L (ref 5–34)
Anion Gap: 10 mEq/L (ref 3–11)
BUN: 12.9 mg/dL (ref 7.0–26.0)
CO2: 25 meq/L (ref 22–29)
Calcium: 9.3 mg/dL (ref 8.4–10.4)
Chloride: 109 mEq/L (ref 98–109)
Creatinine: 0.8 mg/dL (ref 0.6–1.1)
GLUCOSE: 75 mg/dL (ref 70–140)
Potassium: 4.1 mEq/L (ref 3.5–5.1)
SODIUM: 144 meq/L (ref 136–145)
Total Bilirubin: 0.68 mg/dL (ref 0.20–1.20)
Total Protein: 6.8 g/dL (ref 6.4–8.3)

## 2017-04-14 LAB — CBC WITH DIFFERENTIAL/PLATELET
BASO%: 0.3 % (ref 0.0–2.0)
BASOS ABS: 0 10*3/uL (ref 0.0–0.1)
EOS ABS: 0.2 10*3/uL (ref 0.0–0.5)
EOS%: 2.3 % (ref 0.0–7.0)
HCT: 41 % (ref 34.8–46.6)
HGB: 13.4 g/dL (ref 11.6–15.9)
LYMPH%: 26.4 % (ref 14.0–49.7)
MCH: 29.2 pg (ref 25.1–34.0)
MCHC: 32.7 g/dL (ref 31.5–36.0)
MCV: 89.3 fL (ref 79.5–101.0)
MONO#: 0.7 10*3/uL (ref 0.1–0.9)
MONO%: 9.1 % (ref 0.0–14.0)
NEUT#: 4.5 10*3/uL (ref 1.5–6.5)
NEUT%: 61.9 % (ref 38.4–76.8)
Platelets: 185 10*3/uL (ref 145–400)
RBC: 4.59 10*6/uL (ref 3.70–5.45)
RDW: 13.1 % (ref 11.2–14.5)
WBC: 7.3 10*3/uL (ref 3.9–10.3)
lymph#: 1.9 10*3/uL (ref 0.9–3.3)

## 2017-04-14 MED ORDER — TAMOXIFEN CITRATE 20 MG PO TABS: 20.0000 mg | ORAL_TABLET | Freq: Every day | ORAL | 0 refills | Status: DC

## 2017-04-14 NOTE — Progress Notes (Signed)
Radiation Oncology         [phone] ________________________________  Name: Carla Velez MRN: [id number]  Date: 04/14/2017  DOB: 07-14-1976  LE:[id number], No Pcp Per  Erroll Luna, MD     REFERRING PHYSICIAN: Erroll Luna, MD   DIAGNOSIS: The encounter diagnosis was Malignant neoplasm of upper-inner quadrant of right breast in female, estrogen receptor positive ([place]).   HISTORY OF PRESENT ILLNESS:  Carla Velez is a 41 y.o. female seen in multidisciplinary breast clinic for a newly diagnosed right breast cancer. She presented to her gynecologist for routine PE on 03/31/17 and was found to have a 1 x 1 cm palpable mass in the upper inner quadrant of the right breast near the areola.  Diagnostic mammography on 04/05/17 revealed no definite focal abnormality over the inner upper right periareolar region to correspond to the patient's palpable abnormality. Ultrasound was performed that same day and revealed a hypoechoic mass with irregular shape and margins at the 1:30 position, 2 cm from the nipple, corresponding to the patient's palpable abnormality and measuring 1.2 x 1.3 x 2.0 cm. U/S of the right axilla was negative.  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 Hormone receptor status is ER+ (95%), PR+ (90%), HER-2 (negative) and Ki67 (10%). She comes today to review the treatment options and recommendations for her newly diagnosed breast cancer.  PREVIOUS RADIATION THERAPY: No   PAST MEDICAL HISTORY:  Past Medical History:  Diagnosis Date  . Meibomian gland dysfunction        PAST SURGICAL HISTORY: Past Surgical History:  Procedure Laterality Date  . BREAST CYST ASPIRATION    . DILATION AND CURETTAGE OF UTERUS    . TONSILLECTOMY       FAMILY HISTORY:  Family History  Problem Relation Age of Onset  . Breast cancer Mother 17  . Heart disease Father   . Kidney failure Father   . Macrosomia Maternal Grandmother   . Macular degeneration  Maternal Grandmother   . Arthritis Maternal Grandmother   . Heart disease Maternal Grandfather   . Kidney disease Paternal Grandfather      SOCIAL HISTORY:  reports that she has never smoked. She has never used smokeless tobacco. She reports that she drinks alcohol. She reports that she does not use drugs. She is married and lives in [place] with her husband and two daughters.  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   ALLERGIES: Erythromycin and Silicone   MEDICATIONS:  Current Outpatient Prescriptions  Medication Sig Dispense Refill  . ARTIFICIAL TEAR OP Apply to eye as needed.    . Cod Liver Oil 5000-500 UNIT/5ML OIL Take 1 tablet by mouth daily.    . fexofenadine (ALLEGRA) 30 MG tablet Take by mouth as needed.    . loratadine (CLARITIN) 10 MG tablet Take 10 mg by mouth daily as needed for allergies.    . Multiple Vitamin (MULTIVITAMIN) capsule Take 1 capsule by mouth daily.     No current facility-administered medications for this encounter.      REVIEW OF SYSTEMS: On review of systems, the patient reports that she is doing well overall. She reports positive for night sweats, loss of sleep, muscle aching pain to the neck, shoulders, and lower back, breast lump, rash on right breast, migraines, numbness in arms, and anxiety. She denies any shortness of  breath, cough, fevers, chills, unintended weight changes. She denies any bowel or bladder disturbances, and denies abdominal pain, nausea or vomiting. She reports new onset of chest pain last week. A complete review of systems is obtained and is otherwise negative.  PHYSICAL EXAM:  Wt Readings from Last 3 Encounters:  04/14/17 146 lb 12.8 oz (66.6 kg)  03/31/17 145 lb (65.8 kg)   Temp Readings from Last 3 Encounters:  04/14/17 98.4 F (36.9 C) (Oral)   BP  Readings from Last 3 Encounters:  04/14/17 106/64  03/31/17 126/80   Pulse Readings from Last 3 Encounters:  04/14/17 82    In general this is a well appearing caucasian female in no acute distress. She is alert and oriented x4 and appropriate throughout the examination. HEENT reveals that the patient is normocephalic, atraumatic. EOMs are intact. PERRLA. Skin is intact without any evidence of gross lesions. Cardiovascular exam reveals a regular rate and rhythm, no clicks rubs or murmurs are auscultated. Chest is clear to auscultation bilaterally. Bilateral breast exam is performed and reveals mild tenderness to palpation of the right breast at the biopsy site, otherwise normal.  The biopsy site is well healed without signs of infection and only mild residual ecchymosis. The left breast is non-tender to palpation and without masses.  There is no nipple discharge or bleeding bilaterally. Lymphatic assessment is performed and does not reveal any adenopathy in the cervical, supraclavicular, axillary, or inguinal chains. Abdomen has active bowel sounds in all quadrants and is intact. The abdomen is soft, non tender, non distended. Lower extremities are negative for pretibial pitting edema, deep calf tenderness, cyanosis or clubbing.   ECOG = 0  0 - Asymptomatic (Fully active, able to carry on all predisease activities without restriction)  1 - Symptomatic but completely ambulatory (Restricted in physically strenuous activity but ambulatory and able to carry out work of a light or sedentary nature. For example, light housework, office work)  2 - Symptomatic, <50% in bed during the day (Ambulatory and capable of all self care but unable to carry out any work activities. Up and about more than 50% of waking hours)  3 - Symptomatic, >50% in bed, but not bedbound (Capable of only limited self-care, confined to bed or chair 50% or more of waking hours)  4 - Bedbound (Completely disabled. Cannot carry on  any self-care. Totally confined to bed or chair)  5 - Death   Eustace Pen MM, Creech RH, Tormey DC, et al. [phone]). "Toxicity and response criteria of the [facility] Group". [place] Oncol. 5 (6): 649-55    LABORATORY DATA:  Lab Results  Component Value Date   WBC 7.3 04/14/2017   HGB 13.4 04/14/2017   HCT 41.0 04/14/2017   MCV 89.3 04/14/2017   PLT 185 04/14/2017   Lab Results  Component Value Date   NA 144 04/14/2017   K 4.1 04/14/2017   CO2 25 04/14/2017   Lab Results  Component Value Date   ALT 14 04/14/2017   AST 17 04/14/2017   ALKPHOS 65 04/14/2017   BILITOT 0.68 04/14/2017      RADIOGRAPHY: US Breast Ltd Uni Right Inc Axilla  Result Date: 04/05/2017 CLINICAL DATA:  Patient presents for diagnostic right breast examination due to a palpable abnormality felt by her healthcare provider over the upper inner right periareolar region. EXAM: 2D DIGITAL DIAGNOSTIC right MAMMOGRAM WITH CAD AND ADJUNCT TOMO ULTRASOUND right BREAST COMPARISON:  Previous exam(s). ACR Breast Density Category c: The  breast tissue is heterogeneously dense, which may obscure small masses. FINDINGS: Examination demonstrates no definite focal abnormality over the inner upper right periareolar region to correspond to patient's palpable abnormality. Remainder of the exam is unchanged. Mammographic images were processed with CAD. On physical exam, I palpate a 1 cm fixed mass at the 1:30 position of the right breast 2 cm from the nipple. Targeted ultrasound is performed, showing a hypoechoic mass with irregular shape and margins at the 1:30 position 2 cm from the nipple corresponding to patient's palpable abnormality and measuring 1.2 x 1.3 x 2.0 cm. Ultrasound of the right axilla is within normal. IMPRESSION: Suspicious mass over the 1:30 position of the right breast 2 cm from the nipple measuring 1.2 x 1.3 x 2.0 cm corresponding to patient's palpable abnormality. RECOMMENDATION: Recommend  ultrasound-guided core needle biopsy for further evaluation. I have discussed the findings and recommendations with the patient. Results were also provided in writing at the conclusion of the visit. If applicable, a reminder letter will be sent to the patient regarding the next appointment. BI-RADS CATEGORY  4: Suspicious. Patient will be scheduled for biopsy prior to her departure from the [facility] [place]. Electronically Signed   By: Marin Olp M.D.   On: 04/05/2017 16:06   Mm Diag Breast Tomo Uni Right  Result Date: 04/05/2017 CLINICAL DATA:  Patient presents for diagnostic right breast examination due to a palpable abnormality felt by her healthcare provider over the upper inner right periareolar region. EXAM: 2D DIGITAL DIAGNOSTIC right MAMMOGRAM WITH CAD AND ADJUNCT TOMO ULTRASOUND right BREAST COMPARISON:  Previous exam(s). ACR Breast Density Category c: The breast tissue is heterogeneously dense, which may obscure small masses. FINDINGS: Examination demonstrates no definite focal abnormality over the inner upper right periareolar region to correspond to patient's palpable abnormality. Remainder of the exam is unchanged. Mammographic images were processed with CAD. On physical exam, I palpate a 1 cm fixed mass at the 1:30 position of the right breast 2 cm from the nipple. Targeted ultrasound is performed, showing a hypoechoic mass with irregular shape and margins at the 1:30 position 2 cm from the nipple corresponding to patient's palpable abnormality and measuring 1.2 x 1.3 x 2.0 cm. Ultrasound of the right axilla is within normal. IMPRESSION: Suspicious mass over the 1:30 position of the right breast 2 cm from the nipple measuring 1.2 x 1.3 x 2.0 cm corresponding to patient's palpable abnormality. RECOMMENDATION: Recommend ultrasound-guided core needle biopsy for further evaluation. I have discussed the findings and recommendations with the patient. Results were also provided in writing at the  conclusion of the visit. If applicable, a reminder letter will be sent to the patient regarding the next appointment. BI-RADS CATEGORY  4: Suspicious. Patient will be scheduled for biopsy prior to her departure from the [facility]. Electronically Signed   By: Marin Olp M.D.   On: 04/05/2017 16:06   Mm Clip Placement Right  Result Date: 04/07/2017 CLINICAL DATA:  Status post ultrasound-guided core biopsy of mass in the 1:30 o'clock location of the right breast. EXAM: DIAGNOSTIC RIGHT MAMMOGRAM POST ULTRASOUND BIOPSY COMPARISON:  Previous exam(s). FINDINGS: Mammographic images were obtained following ultrasound guided biopsy of mass the 1:30 o'clock location of the right breast. A ribbon shaped clip is identified in the upper inner quadrant of the right breast as expected. IMPRESSION: Tissue marker clip in the expected location following biopsy. Final Assessment: Post Procedure Mammograms for Marker Placement Electronically Signed   By: Nolon Nations M.D.  On: 04/07/2017 15:50   [place] Rt Breast Bx W Loc Dev 1st Lesion Img Bx Spec US Guide  Addendum Date: 04/08/2017   ADDENDUM REPORT: 04/08/2017 14:08 ADDENDUM: Pathology revealed GRADE II INVASIVE DUCTAL CARCINOMA of the Right breast, 1:30 o'clock. This was found to be concordant by Dr. Nolon Nations. Pathology results were discussed with the patient by telephone. The patient reported doing well after the biopsy with tenderness at the site. Post biopsy instructions and care were reviewed and questions were answered. The patient was encouraged to call [facility] for any additional concerns. The patient was referred to [facility] on Apr 14, 2017. Pathology results reported by Terie Purser, RN on 04/08/2017. Electronically Signed   By: Nolon Nations M.D.   On: 04/08/2017 14:08   Result Date: 04/08/2017 CLINICAL DATA:  Patient presents for  ultrasound-guided core biopsy of mass in the 1:30 o'clock location of the right breast. EXAM: ULTRASOUND GUIDED RIGHT BREAST CORE NEEDLE BIOPSY COMPARISON:  Previous exam(s). FINDINGS: I met with the patient and we discussed the procedure of ultrasound-guided biopsy, including benefits and alternatives. We discussed the high likelihood of a successful procedure. We discussed the risks of the procedure, including infection, bleeding, tissue injury, clip migration, and inadequate sampling. Informed written consent was given. The usual time-out protocol was performed immediately prior to the procedure. Lesion quadrant: Upper inner quadrant of the right breast Using sterile technique and 1% Lidocaine as local anesthetic, under direct ultrasound visualization, a 12 gauge spring-loaded device was used to perform biopsy of mass in the 1:30 o'clock location of the right breast using a inferior approach. At the conclusion of the procedure a ribbon tissue marker clip was deployed into the biopsy cavity. Follow up 2 view mammogram was performed and dictated separately. IMPRESSION: Ultrasound guided biopsy of right breast mass. No apparent complications. Electronically Signed: By: Nolon Nations M.D. On: 04/07/2017 15:49       IMPRESSION/PLAN: 1. 41 y.o. female with Stage 1A, cT1cN0Mx, grade II, ER+/PR+/Her2 negative invasive ductal carcinoma of the right breast. Dr. Lisbeth Renshaw discusses the pathology findings and reviews the nature of invasive disease. The consensus from the breast conference include breast conservation with lumpectomy. Due to the patient's age and family history of breast cancer in her mother, she will be referred for genetic testing. Her tumor will be sent for Oncotype DX testing to determine the role for chemotherapy. Provided that chemotherapy is not indicated, the patient's course would then be followed by external radiotherapy to the breast followed by antiestrogen therapy. If chemotherapy is  indicated, we would follow chemotherapy. We discussed the risks, benefits, short, and long term effects of radiotherapy, and the patient is interested in proceeding. Dr. Lisbeth Renshaw discusses the delivery and logistics of radiotherapy and reviews his recommendation for a course of 6-1/2 weeks of radiotherapy. We will see her back about 2 weeks after surgery to move forward with the simulation and planning process and anticipate starting radiotherapy about 4 weeks after surgery.   The above documentation reflects my direct findings during this shared patient visit. Please see separate note by Dr. Lisbeth Renshaw on this date for the remainder of the patient's plan of care.    Nicholos Johns, PA-C  This document serves as a record of services personally performed by Kyung Rudd, MD and Freeman Caldron, PA-C. It was created on their behalf by Bethann Humble, a trained medical scribe. The creation of  this record is based on the scribe's personal observations and the provider's statements to them. This document has been checked and approved by the attending provider.

## 2017-04-14 NOTE — Assessment & Plan Note (Signed)
04/07/2017 Palpable right breast lump by referring physician at 1:30 position 2 cm mass, axilla negative, biopsy: Grade 2 IDC ER 95%, PR 90%, Ki-67 10%, HER-2 negative ratio 1.4, T1 CN 0 stage IA clinical stage  Pathology and radiology counseling:Discussed with the patient, the details of pathology including the type of breast cancer,the clinical staging, the significance of ER, PR and HER-2/neu receptors and the implications for treatment. After reviewing the pathology in detail, we proceeded to discuss the different treatment options between surgery, radiation, chemotherapy, antiestrogen therapies.  Recommendations: 1. Breast conserving surgery followed by 2. Oncotype DX testing to determine if chemotherapy would be of any benefit followed by 3. Adjuvant radiation therapy followed by 4. Adjuvant antiestrogen therapy  Oncotype counseling: I discussed Oncotype DX test. I explained to the patient that this is a 21 gene panel to evaluate patient tumors DNA to calculate recurrence score. This would help determine whether patient has high risk or intermediate risk or low risk breast cancer. She understands that if her tumor was found to be high risk, she would benefit from systemic chemotherapy. If low risk, no need of chemotherapy. If she was found to be intermediate risk, we would need to evaluate the score as well as other risk factors and determine if an abbreviated chemotherapy may be of benefit.  Return to clinic after surgery to discuss final pathology report and then determine if Oncotype DX testing will need to be sent.

## 2017-04-14 NOTE — Therapy (Signed)
[place], [place], [address] Phone: [phone]   Fax:  [phone]  Physical Therapy Evaluation  Patient Details  Name: Carla Velez MRN: [id number] Date of Birth: August 24, 1976 Referring Provider: Dr. Erroll Luna  Encounter Date: 04/14/2017      PT End of Session - 04/14/17 1544    Visit Number 1   Number of Visits 1   PT Start Time 7124   PT Stop Time 0933  Also saw pt from 1003-1018 for a total of 36 minutes   PT Time Calculation (min) 21 min   Activity Tolerance Patient tolerated treatment well   Behavior During Therapy [facility] for tasks assessed/performed      Past Medical History:  Diagnosis Date  . Meibomian gland dysfunction     Past Surgical History:  Procedure Laterality Date  . BREAST CYST ASPIRATION    . DILATION AND CURETTAGE OF UTERUS    . TONSILLECTOMY      There were no vitals filed for this visit.       Subjective Assessment - 04/14/17 1440    Subjective Patient reports she is here to be seen by her medical team for her newly diagnosed right breast cancer.   Patient is accompained by: Family member   Pertinent History Patient was diagnosed on 04/05/17 with right grade 2 invasive ductal carcinoma breast cancer. It measures 2 cm and is located in the upper inner quadrant. It is ER/PR positive and HER2 negative with a Ki67 of 10%. She has no other health problems.   Patient Stated Goals Reduce lymphedema risk and learn post op shoulder ROM HEP   Currently in Pain? Yes   Pain Score 2    Pain Location Neck   Pain Orientation Right;Left   Pain Descriptors / Indicators Aching;Tightness   Pain Type Chronic pain   Pain Radiating Towards shoulders   Pain Onset More than a month ago   Pain Frequency Intermittent   Aggravating Factors  Stress   Pain Relieving Factors Relaxation   Multiple Pain Sites No            [facility] PT Assessment - 04/14/17 0001      Assessment    Medical Diagnosis Right breast cancer   Referring Provider Dr. Marcello Moores [place]   Onset Date/Surgical Date 04/05/17   Hand Dominance Right   Prior Therapy none     Precautions   Precautions Other (comment)     Restrictions   Weight Bearing Restrictions No     Balance Screen   Has the patient fallen in the past 6 months No   Has the patient had a decrease in activity level because of a fear of falling?  No   Is the patient reluctant to leave their home because of a fear of falling?  No     Home Social worker Private residence   Living Arrangements Spouse/significant other;Children  Husband, 37 and 37 y.o. 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   Leisure She walks 2x/week for 30 min and yoga 1x/week     Cognition   Overall Cognitive Status Within Functional Limits for tasks assessed     Posture/Postural Control   Posture/Postural Control Postural limitations   Postural Limitations Rounded Shoulders     ROM / Strength   AROM /  PROM / Strength AROM;Strength     AROM   AROM Assessment Site Shoulder;Cervical   Right/Left Shoulder Right;Left   Right Shoulder Extension 60 Degrees   Right Shoulder Flexion 163 Degrees   Right Shoulder ABduction 171 Degrees   Right Shoulder Internal Rotation 79 Degrees   Right Shoulder External Rotation 86 Degrees   Left Shoulder Extension 62 Degrees   Left Shoulder Flexion 167 Degrees   Left Shoulder ABduction 171 Degrees   Left Shoulder Internal Rotation 76 Degrees   Left Shoulder External Rotation 84 Degrees   Cervical Flexion WNL   Cervical Extension WNL   Cervical - Right Side Bend 25% limited   Cervical - Left Side Bend 25% limited   Cervical - Right Rotation WNL   Cervical - Left Rotation WNL     Strength   Overall Strength Within functional limits for tasks performed            LYMPHEDEMA/ONCOLOGY QUESTIONNAIRE - 04/14/17 1543      Type   Cancer Type Right breast cancer     Lymphedema Assessments   Lymphedema Assessments Upper extremities     Right Upper Extremity Lymphedema   10 cm Proximal to Olecranon Process 27 cm   Olecranon Process 23.5 cm   10 cm Proximal to Ulnar Styloid Process 20.6 cm   Just Proximal to Ulnar Styloid Process 15.4 cm   Across Hand at [employer] 18.4 cm   At [place] of 2nd Digit 5.8 cm     Left Upper Extremity Lymphedema   10 cm Proximal to Olecranon Process 26.8 cm   Olecranon Process 23.5 cm   10 cm Proximal to Ulnar Styloid Process 20.2 cm   Just Proximal to Ulnar Styloid Process 14.7 cm   Across Hand at [employer] 17.9 cm   At [place] of 2nd Digit 5.4 cm      Patient was instructed today in a home exercise program today for post op shoulder range of motion. These included active assist shoulder flexion in sitting, scapular retraction, wall walking with shoulder abduction, and hands behind head external rotation.  She was encouraged to do these twice a day, holding 3 seconds and repeating 5 times when permitted by her physician.         PT Education - 04/14/17 1544    Education provided Yes   Education Details Lymphedema risk reduction and post op shoulder ROM HEP   Person(s) Educated Patient;Spouse   Methods Explanation;Demonstration;Handout   Comprehension Returned demonstration;Verbalized understanding              Breast Clinic Goals - 04/14/17 1547      Patient will be able to verbalize understanding of pertinent lymphedema risk reduction practices relevant to her diagnosis specifically related to skin care.   Time 1   Period Days   Status Achieved     Patient will be able to return demonstrate and/or verbalize understanding of the post-op home exercise program related to regaining shoulder range of motion.   Time 1   Period Days   Status Achieved     Patient will be able to  verbalize understanding of the importance of attending the postoperative After Breast Cancer Class for further lymphedema risk reduction education and therapeutic exercise.   Time 1   Period Days   Status Achieved              Plan - 04/14/17 1545    Clinical Impression Statement Patient was diagnosed on  04/05/17 with right grade 2 invasive ductal carcinoma breast cancer. It measures 2 cm and is located in the upper inner quadrant. It is ER/PR positive and HER2 negative with a Ki67 of 10%. She has no other health problems. Her multidisciplinary medical team met prior to her assessments to determine a recommended treatment plan. She is planning to have a right lumpectomy and sentinel node biopsy followed by Oncotype testing, radiation, and anti-estrogen therapy. She may benefit from post op PT to regain shoulder ROM and reduce lymphedema risk. Due to her lack of comorbidities, her eval is of low complexity.   Rehab Potential Excellent   Clinical Impairments Affecting Rehab Potential None   PT Frequency One time visit   PT Treatment/Interventions Therapeutic exercise;Patient/family education   PT Next Visit Plan Will f/u after surgery to determine PT needs   PT Home Exercise Plan Post op shoulder ROM HEP   Consulted and Agree with Plan of Care Patient;Family member/caregiver   Family Member Consulted Husband      Patient will benefit from skilled therapeutic intervention in order to improve the following deficits and impairments:  Postural dysfunction, Decreased knowledge of precautions, Pain, Impaired UE functional use, Decreased range of motion  Visit Diagnosis: Carcinoma of upper-inner quadrant of right breast in female, estrogen receptor positive ([place]) - Plan: PT plan of care cert/re-cert  Abnormal posture - Plan: PT plan of care cert/re-cert   Patient will follow up at outpatient cancer rehab if needed following surgery.  If the patient requires physical therapy at that time, a  specific plan will be dictated and sent to the referring physician for approval. The patient was educated today on appropriate basic range of motion exercises to begin post operatively and the importance of attending the After Breast Cancer class following surgery.  Patient was educated today on lymphedema risk reduction practices as it pertains to recommendations that will benefit the patient immediately following surgery.  She verbalized good understanding.  No additional physical therapy is indicated at this time.      Problem List Patient Active Problem List   Diagnosis Date Noted  . Malignant neoplasm of upper-inner quadrant of right breast in female, estrogen receptor positive ([place]) 04/13/2017    Annia Friendly, PT 04/14/17 3:50 PM  [place], [place], [address] Phone: [phone]   Fax:  [phone]  Name: Carla Velez MRN: [id number] Date of Birth: 1976/01/25

## 2017-04-14 NOTE — H&P (Signed)
Carla Velez 04/14/2017 7:26 AM Location: [facility] Patient #: [id number] DOB: 1976-02-20 Undefined / Language: Carla Velez / Race: White Female  History of Present Illness Carla Velez A. Thi [name] MD; 04/14/2017 11:20 AM) Patient words: patient sent at the request of Carla Velez fter the patientnoticed a lump in her right medial breast. This was picked up by her primary care doctor as well. Imaging showed a 2 cm mass location 1:30 right breast upper inner quadrant with a negative appearing axilla core biopsy gradesitinvasive ductal carcinoma ER positive PR positive HER-2/neu negative. No family history of breast cancer. The patient had a previous right breast cyst. This was aspirated 2 years ago. Patienge on other problem with her brer brts.le discharge or other problem with her brer breast          CLINICAL DATA: Patient presents for diagnostic right breast examination due to a palpable abnormality felt by her healthcare provider over the upper inner right periareolar region.  EXAM: 2D DIGITAL DIAGNOSTIC right MAMMOGRAM WITH CAD AND ADJUNCT TOMO  ULTRASOUND right BREAST  COMPARISON: Previous exam(s).  ACR Breast Density Category c: The breast tissue is heterogeneously dense, which may obscure small masses.  FINDINGS: Examination demonstrates no definite focal abnormality over the inner upper right periareolar region to correspond to patient's palpable abnormality. Remainder of the exam is unchanged.  Mammographic images were processed with CAD.  On physical exam, I palpate a 1 cm fixed mass at the 1:30 position of the right breast 2 cm from the nipple.  Targeted ultrasound is performed, showing a hypoechoic mass with irregular shape and margins at the 1:30 position 2 cm from the nipple corresponding to patient's palpable abnormality and measuring 1.2 x 1.3 x 2.0 cm.  Ultrasound of the right axilla is within normal.  IMPRESSION: Suspicious mass over the 1:30  position of the right breast 2 cm from the nipple measuring 1.2 x 1.3 x 2.0 cm corresponding to patient's palpable abnormality.  RECOMMENDATION: Recommend ultrasound-guided core needle biopsy for further evaluation.  I have discussed the findings and recommendations with the patient. Results were also provided in writing at the conclusion of the visit. If applicable, a reminder letter will be sent to the patient regarding the next appointment.  BI-RADS CATEGORY 4: Suspicious.  Patient will be scheduled for biopsy prior to her departure from the [facility].   Electronically Signed By: Carla Velez M.D. On: 04/05/2017 16:06               ADDITIONAL INFORMATION: PROGNOSTIC INDICATORS Results: IMMUNOHISTOCHEMICAL AND MORPHOMETRIC ANALYSIS PERFORMED MANUALLY Estrogen Receptor: 95%, POSITIVE, STRONG STAINING INTENSITY Progesterone Receptor: 90%, POSITIVE, STRONG STAINING INTENSITY Proliferation Marker Ki67: 10% REFERENCE RANGE ESTROGEN RECEPTOR NEGATIVE 0% POSITIVE =>1% REFERENCE RANGE PROGESTERONE RECEPTOR NEGATIVE 0% POSITIVE =>1% All controls stained appropriately Carla Velez, Electronic Signature ( Signed 04/13/2017) FLUORESCENCE IN-SITU HYBRIDIZATION Results: HER2 - NEGATIVE RATIO OF HER2/CEP17 SIGNALS 1.40 AVERAGE HER2 COPY NUMBER PER CELL 2.10 Reference Range: NEGATIVE HER2/CEP17 Ratio <2.0 and average HER2 copy number <4.0 EQUIVOCAL HER2/CEP17 Ratio <2.0 and average HER2 copy number >=4.0 and <6.0 1 of 3 FINAL for Carla Velez ([id number]) ADDITIONAL INFORMATION:(continued) POSITIVE HER2/CEP17 Ratio >=2.0 or <2.0 and average HER2 copy number >=6.0 Carla Velez, Electronic Signature ( Signed 04/12/2017) FINAL DIAGNOSIS Diagnosis Breast, right, needle core biopsy, 1:30 o'clock - INVASIVE DUCTAL CARCINOMA - SEE COMMENT Microscopic Comment Based on the biopsy, the carcinoma appears Nottingham grade 2 of  3 and measures 1.1 cm in greatest linear  extent. Prognostic markers (ER/PR/HER2-FISH) are pending and will be reported in an addendum. Carla Velez has reviewed the case and agrees with above diagnosis. These.  The patient is a 41 year old female.   Past Surgical History Carla Slipper, RN; 04/14/2017 7:26 AM) Breast Biopsy Right. Tonsillectomy  Diagnostic Studies History Carla Slipper, RN; 04/14/2017 7:26 AM) Colonoscopy never Mammogram within last year Pap Smear 1-5 years ago  Medication History Carla Slipper, RN; 04/14/2017 7:26 AM) Medications Reconciled  Social History Carla Slipper, RN; 04/14/2017 7:26 AM) Alcohol use Occasional alcohol use. Caffeine use Coffee. No drug use Tobacco use Never smoker.  Family History Carla Slipper, RN; 04/14/2017 7:26 AM) Alcohol Abuse Family Members In General. Anesthetic complications Family Members In General. Arthritis Family Members In General, Mother. Breast Cancer Mother. Cerebrovascular Accident Family Members In General. Depression Mother. Heart Disease Family Members In General, Father. Heart disease in female family member before age 22 Hypertension Family Members In General, Father. Kidney Disease Father. Migraine Headache Mother.  Pregnancy / Birth History Carla Slipper, RN; 04/14/2017 7:26 AM) Age at menarche 74 years. Contraceptive History Intrauterine device, Oral contraceptives. Gravida 4 Length (months) of breastfeeding 7-12 Maternal age 72-20 Para 2 Regular periods  Other Problems Carla Slipper, RN; 04/14/2017 7:26 AM) Anxiety Disorder Back Pain Hemorrhoids Kidney Stone Lump In Breast Migraine Headache     Review of Systems Carla Slipper RN; 04/14/2017 7:26 AM) General Present- Appetite Loss and Night Sweats. Not Present- Chills, Fatigue, Fever, Weight Gain and Weight Loss. Skin Present- Rash. Not Present- Change in Wart/Mole, Dryness, Hives, Jaundice, New Lesions, Non-Healing Wounds and  Ulcer. HEENT Present- Seasonal Allergies and Wears glasses/contact lenses. Not Present- Earache, Hearing Loss, Hoarseness, Nose Bleed, Oral Ulcers, Ringing in the Ears, Sinus Pain, Sore Throat, Visual Disturbances and Yellow Eyes. Respiratory Not Present- Bloody sputum, Chronic Cough, Difficulty Breathing, Snoring and Wheezing. Breast Present- Breast Mass. Not Present- Breast Pain, Nipple Discharge and Skin Changes. Cardiovascular Not Present- Chest Pain, Difficulty Breathing Lying Down, Leg Cramps, Palpitations, Rapid Heart Rate, Shortness of Breath and Swelling of Extremities. Gastrointestinal Present- Hemorrhoids. Not Present- Abdominal Pain, Bloating, Bloody Stool, Change in Bowel Habits, Chronic diarrhea, Constipation, Difficulty Swallowing, Excessive gas, Gets full quickly at meals, Indigestion, Nausea, Rectal Pain and Vomiting. Female Genitourinary Not Present- Frequency, Nocturia, Painful Urination, Pelvic Pain and Urgency. Musculoskeletal Not Present- Back Pain, Joint Pain, Joint Stiffness, Muscle Pain, Muscle Weakness and Swelling of Extremities. Neurological Present- Numbness. Not Present- Decreased Memory, Fainting, Headaches, Seizures, Tingling, Tremor, Trouble walking and Weakness. Psychiatric Present- Anxiety and Change in Sleep Pattern. Not Present- Bipolar, Depression, Fearful and Frequent crying. Endocrine Not Present- Cold Intolerance, Excessive Hunger, Hair Changes, Heat Intolerance, Hot flashes and New Diabetes. Hematology Not Present- Blood Thinners, Easy Bruising, Excessive bleeding, Gland problems, HIV and Persistent Infections.   Physical Exam (Ceaser Ebeling A. Keyen Marban MD; 04/14/2017 11:21 AM)  General Mental Status-Alert. General Appearance-Consistent with stated age. Hydration-Well hydrated. Voice-Normal.  Head and Neck Head-normocephalic, atraumatic with no lesions or palpable masses.  Eye Eyeball - Bilateral-Extraocular movements intact. Sclera/Conjunctiva  - Bilateral-No scleral icterus.  Chest and Lung Exam Chest and lung exam reveals -quiet, even and easy respiratory effort with no use of accessory muscles and on auscultation, normal breath sounds, no adventitious sounds and normal vocal resonance. Inspection Chest Wall - Normal. Back - normal.  Breast Note: bruising right breast notedover the medial aspect.  Left breast normal. No masses in either breast.  Cardiovascular Cardiovascular examination reveals -on palpation PMI is normal in location  and amplitude, no palpable S3 or S4. Normal cardiac borders., normal heart sounds, regular rate and rhythm with no murmurs, carotid auscultation reveals no bruits and normal pedal pulses bilaterally.  Abdomen Inspection Inspection of the abdomen reveals - No Hernias. Skin - Scar - no surgical scars. Palpation/Percussion Palpation and Percussion of the abdomen reveal - Soft, Non Tender, No Rebound tenderness, No Rigidity (guarding) and No hepatosplenomegaly. Auscultation Auscultation of the abdomen reveals - Bowel sounds normal.  Neurologic Neurologic evaluation reveals -alert and oriented x 3 with no impairment of recent or remote memory. Mental Status-Normal.  Musculoskeletal Normal Exam - Left-Upper Extremity Strength Normal and Lower Extremity Strength Normal. Normal Exam - Right-Upper Extremity Strength Normal, Lower Extremity Weakness.    Assessment & Plan (Ona Rathert A. Valisha Heslin MD; 04/14/2017 11:22 AM)  BREAST CANCER, RIGHT (C50.911) Impression: discussed odtiononstruction.conservation versus mastectomy and reconstruction. The patient will be genetic testing. Patient hright axillary sentinel lymph node mapping.y with right axillary sentinel lymph node mapping. Discussed expectations and cosmetic outcomes. Risk of lumpectomy include bleeding, infection, seroma, more surgery, use of seed/wire, wound care, cosmetic deformity and the need for other treatments, death , blood  clots, death. Pt agrees to proceed. Risk of sentinel lymph node mapping include bleeding, infection, lymphedema, shoulder pain. stiffness, dye allergy. cosmetic deformity , blood clots, death, need for more surgery. Pt agres to proceed.  Current Plans You are being scheduled for surgery- Our schedulers will call you.  You should hear from our office's scheduling department within 5 working days about the location, date, and time of surgery. We try to make accommodations for patient's preferences in scheduling surgery, but sometimes the OR schedule or the surgeon's schedule prevents [place] from making those accommodations.  If you have not heard from our office [phone]) in 5 working days, call the office and ask for your surgeon's nurse.  If you have other questions about your diagnosis, plan, or surgery, call the office and ask for your surgeon's nurse.  Pt Education - [facility] Breast Cancer Information Given - [facility] "Breast Journey" Package Pt Education - Pamphlet Given - Breast Biopsy: discussed with patient and provided information. We discussed the staging and pathophysiology of breast cancer. We discussed all of the different options for treatment for breast cancer including surgery, chemotherapy, radiation therapy, Herceptin, and antiestrogen therapy. We discussed a sentinel lymph node biopsy as she does not appear to having lymph node involvement right now. We discussed the performance of that with injection of radioactive tracer and blue dye. We discussed that she would have an incision underneath her axillary hairline. We discussed that there is a bout a 10-20% chance of having a positive node with a sentinel lymph node biopsy and we will await the permanent pathology to make any other first further decisions in terms of her treatment. One of these options might be to return to the operating room to perform an axillary lymph node dissection. We discussed about a 1-2% risk lifetime of chronic  shoulder pain as well as lymphedema associated with a sentinel lymph node biopsy. We discussed the options for treatment of the breast cancer which included lumpectomy versus a mastectomy. We discussed the performance of the lumpectomy with a wire placement. We discussed a 10-20% chance of a positive margin requiring reexcision in the operating room. We also discussed that she may need radiation therapy or antiestrogen therapy or both if she undergoes lumpectomy. We discussed the mastectomy and the postoperative care for that as well. We discussed that there  is no difference in her survival whether she undergoes lumpectomy with radiation therapy or antiestrogen therapy versus a mastectomy. There is a slight difference in the local recurrence rate being 3-5% with lumpectomy and about 1% with a mastectomy. We discussed the risks of operation including bleeding, infection, possible reoperation. She understands her further therapy will be based on what her stages at the time of her operation.  Pt Education - [facility] breast cancer surgery: discussed with patient and provided information. Pt Education - [facility] (After Breast Cancer) Class Info: discussed with patient and provided information.

## 2017-04-14 NOTE — Progress Notes (Signed)
[place] CONSULT NOTE  Patient Care Team: Patient, No Pcp Per as PCP - General (General Practice) [name], MD as Consulting Physician (Obstetrics and Gynecology) Erroll Luna, MD as Consulting Physician (General Surgery) Nicholas Lose, MD as Consulting Physician (Hematology and Oncology) Kyung Rudd, MD as Consulting Physician (Radiation Oncology)  CHIEF COMPLAINTS/PURPOSE OF CONSULTATION:  Newly diagnosed breast cancer  HISTORY OF PRESENTING ILLNESS:  Carla Velez 41 y.o. female is here because of recent diagnosis of right breast cancer. Patient had a family history of mother age 52 with breast cancer. She had a palpable right breast lump detected by her physician. This was located at 1:30 position and it was 2 cm in size by ultrasound. Axilla was negative. 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 She was presented this morning of the multidisciplinary tumor board and she is here today to discuss the treatment plan.  I reviewed her records extensively and collaborated the history with the patient.  SUMMARY OF ONCOLOGIC HISTORY:   Malignant neoplasm of upper-inner quadrant of right breast in female, estrogen receptor positive ([place])   04/07/2017 Initial Diagnosis    Palpable right breast lump by referring physician at 1:30 position 2 cm mass, axilla negative, biopsy: Grade 2 IDC ER 95%, PR 90%, Ki-67 10%, HER-2 negative ratio 1.4, T1 CN 0 stage IA clinical stage      MEDICAL HISTORY:  Past Medical History:  Diagnosis Date  . Meibomian gland dysfunction     SURGICAL HISTORY: Past Surgical History:  Procedure Laterality Date  . BREAST CYST ASPIRATION    . DILATION AND CURETTAGE OF UTERUS    . TONSILLECTOMY      SOCIAL HISTORY: Social History   Social History  . Marital status: Married    Spouse name: N/A  . Number of children: N/A  . Years of education:  N/A   Occupational History  . Not on file.   Social History Main Topics  . Smoking status: Never Smoker  . Smokeless tobacco: Never Used  . Alcohol use Yes  . Drug use: No  . Sexual activity: Yes    Birth control/ protection: None   Other Topics Concern  . Not on file   Social History Narrative  . No narrative on file    FAMILY HISTORY: Family History  Problem Relation Age of Onset  . Breast cancer Mother 30  . Heart disease Father   . Kidney failure Father   . Macrosomia Maternal Grandmother   . Macular degeneration Maternal Grandmother   . Arthritis Maternal Grandmother   . Heart disease Maternal Grandfather   . Kidney disease Paternal Grandfather     ALLERGIES:  is allergic to erythromycin and silicone.  MEDICATIONS:  Current Outpatient Prescriptions  Medication Sig Dispense Refill  . loratadine (CLARITIN) 10 MG tablet Take 10 mg by mouth daily as needed for allergies.    . Multiple Vitamin (MULTIVITAMIN) capsule Take 1 capsule by mouth daily.    . ARTIFICIAL TEAR OP Apply to eye as needed.    . Cod Liver Oil 5000-500 UNIT/5ML OIL Take 1 tablet by mouth daily.    . fexofenadine (ALLEGRA) 30 MG tablet Take by mouth as needed.    . tamoxifen (NOLVADEX) 20 MG tablet Take 1 tablet (20 mg total) by mouth daily. 30 tablet 0   No current facility-administered medications for this visit.     REVIEW OF SYSTEMS:  Constitutional: Denies fevers, chills or abnormal night sweats Eyes: Denies blurriness of vision, double vision or watery eyes Ears, nose, mouth, throat, and face: Denies mucositis or sore throat Respiratory: Denies cough, dyspnea or wheezes Cardiovascular: Denies palpitation, chest discomfort or lower extremity swelling Gastrointestinal:  Denies nausea, heartburn or change in bowel habits Skin: Denies abnormal skin rashes Lymphatics: Denies new lymphadenopathy or easy bruising Neurological:Denies numbness, tingling or new weaknesses Behavioral/Psych: Mood  is stable, no new changes  Breast:  Small palpable lump in the right breast no palpable lymphadenopathy All other systems were reviewed with the patient and are negative.  PHYSICAL EXAMINATION: ECOG PERFORMANCE STATUS: 1 - Symptomatic but completely ambulatory  Vitals:   04/14/17 0908  BP: 106/64  Pulse: 82  Resp: 18  Temp: 98.4 F (36.9 C)   Filed Weights   04/14/17 0908  Weight: 146 lb 12.8 oz (66.6 kg)    GENERAL:alert, no distress and comfortable SKIN: skin color, texture, turgor are normal, no rashes or significant lesions EYES: normal, conjunctiva are pink and non-injected, sclera clear OROPHARYNX:no exudate, no erythema and lips, buccal mucosa, and tongue normal  NECK: supple, thyroid normal size, non-tender, without nodularity LYMPH:  no palpable lymphadenopathy in the cervical, axillary or inguinal LUNGS: clear to auscultation and percussion with normal breathing effort HEART: regular rate & rhythm and no murmurs and no lower extremity edema ABDOMEN:abdomen soft, non-tender and normal bowel sounds Musculoskeletal:no cyanosis of digits and no clubbing  PSYCH: alert & oriented x 3 with fluent speech NEURO: no focal motor/sensory deficits BREAST:Small palpable lump in the right breast no palpable lymphadenopathy (exam performed in the presence of a chaperone)   LABORATORY DATA:  I have reviewed the data as listed Lab Results  Component Value Date   WBC 7.3 04/14/2017   HGB 13.4 04/14/2017   HCT 41.0 04/14/2017   MCV 89.3 04/14/2017   PLT 185 04/14/2017   Lab Results  Component Value Date   NA 144 04/14/2017   K 4.1 04/14/2017   CO2 25 04/14/2017    RADIOGRAPHIC STUDIES: I have personally reviewed the radiological reports and agreed with the findings in the report.  ASSESSMENT AND PLAN:  Malignant neoplasm of upper-inner quadrant of right breast in female, estrogen receptor positive ([place]) 04/07/2017 Palpable right breast lump by referring physician at 1:30  position 2 cm mass, axilla negative, biopsy: Grade 2 IDC ER 95%, PR 90%, Ki-67 10%, HER-2 negative ratio 1.4, T1 CN 0 stage IA clinical stage  Pathology and radiology counseling:Discussed with the patient, the details of pathology including the type of breast cancer,the clinical staging, the significance of ER, PR and HER-2/neu receptors and the implications for treatment. After reviewing the pathology in detail, we proceeded to discuss the different treatment options between surgery, radiation, chemotherapy, antiestrogen therapies.  Recommendations: 1. Breast conserving surgery followed by 2. Oncotype DX testing to determine if chemotherapy would be of any benefit followed by 3. Adjuvant radiation therapy followed by 4. Adjuvant antiestrogen therapy  Oncotype counseling: I discussed Oncotype DX test. I explained to the patient that this is a 21 gene panel to evaluate patient tumors DNA to calculate recurrence score. This would help determine whether patient has high risk or intermediate risk or low risk breast cancer. She understands that if her tumor was found to be high risk, she would benefit from systemic chemotherapy. If low risk, no need of chemotherapy. If she was found to be intermediate risk, we would need to evaluate the score as well  as other risk factors and determine if an abbreviated chemotherapy may be of benefit.  Return to clinic after surgery to discuss final pathology report and then determine if Oncotype DX testing will need to be sent.     All questions were answered. The patient knows to call the clinic with any problems, questions or concerns.    Rulon Eisenmenger, MD 04/14/17

## 2017-04-14 NOTE — Progress Notes (Signed)
Nutrition Assessment  Reason for Assessment:  Pt seen in Breast Clinic  ASSESSMENT:   41 year old female with new diagnosis of breast cancer.  Past medical history reviewed.  Medications:  MVI  Labs: reviewed  Anthropometrics:   Height: 66 inches Weight: 146 lb 12.8 oz BMI: 23.7   NUTRITION DIAGNOSIS: Food and nutrition related knowledge deficit related to new diagnosis of breast cancer as evidenced by no prior need for nutrition related information.  INTERVENTION:   Discussed and provided packet of information regarding nutritional tips for breast cancer patients.  Questions answered.  Teachback method used.  Contact information provided and patient knows to contact me with questions/concerns.    MONITORING, EVALUATION, and GOAL: Pt will consume a healthy plant based diet to maintain lean body mass throughout treatment.   Varshini Arrants B. Zenia Resides, [place], [place] Registered Dietitian [phone] (pager)

## 2017-04-14 NOTE — Progress Notes (Signed)
Clinical Social Work [place] Psychosocial Distress Screening [place]  Patient completed distress screening protocol and scored a 9 on the Psychosocial Distress Thermometer which indicates severe distress. Clinical Social Worker met with patient and patients friend in [facility] to assess for distress and other psychosocial needs. Patient stated she was feeling overwhelmed but felt "better" after meeting with the treatment team and getting more information on her treatment plan. CSW and patient discussed common feeling and emotions when being diagnosed with cancer, and the importance of support during treatment. CSW informed patient of the support team and support services at [facility]. CSW provided contact information and encouraged patient to call with any questions or concerns.  ONCBCN DISTRESS SCREENING 04/14/2017  Screening Type Initial Screening  Distress experienced in past week (1-10) 9  Emotional problem type Nervousness/Anxiety;Adjusting to illness  Information Concerns Type Lack of info about diagnosis;Lack of info about treatment  Physical Problem type Sleep/insomnia  Physician notified of physical symptoms Yes     Johnnye Lana, MSW, LCSW, OSW-C Clinical Social Worker [place] [phone]

## 2017-04-14 NOTE — Patient Instructions (Signed)

## 2017-04-15 ENCOUNTER — Other Ambulatory Visit: Payer: BLUE CROSS/BLUE SHIELD

## 2017-04-15 ENCOUNTER — Ambulatory Visit (HOSPITAL_BASED_OUTPATIENT_CLINIC_OR_DEPARTMENT_OTHER): Payer: BLUE CROSS/BLUE SHIELD | Admitting: Genetics

## 2017-04-15 DIAGNOSIS — Z315 Encounter for genetic counseling: Secondary | ICD-10-CM

## 2017-04-15 DIAGNOSIS — C50911 Malignant neoplasm of unspecified site of right female breast: Secondary | ICD-10-CM

## 2017-04-15 DIAGNOSIS — Z170 Estrogen receptor positive status [ER+]: Principal | ICD-10-CM

## 2017-04-16 ENCOUNTER — Encounter: Payer: Self-pay | Admitting: Genetics

## 2017-04-16 NOTE — Progress Notes (Signed)
REFERRING PROVIDER: Nicholas Lose, MD [place], [place] [id number]  PRIMARY PROVIDER:  Patient, No Pcp Per  PRIMARY REASON FOR VISIT:  1. Malignant neoplasm of right breast in female, estrogen receptor positive, unspecified site of breast ([place])      HISTORY OF PRESENT ILLNESS:   Ms. Dafoe, a 41 y.o. female, was seen for a [place] cancer genetics consultation at the request of Dr. Lindi Adie due to a personal and family history of cancer.  Ms. Zeisler presents to clinic today to discuss the possibility of a hereditary predisposition to cancer, genetic testing, and to further clarify her future cancer risks, as well as potential cancer risks for family members.   In May 2018, at the age of 41, Ms. Penny was diagnosed with ER/PR+ HER2- invasive ductal carcinoma of the right breast. Her treatment is pending. Ms. Oblinger was accompanied to her appointment by her husband, Randall Hiss.  CANCER HISTORY:    Malignant neoplasm of upper-inner quadrant of right breast in female, estrogen receptor positive ([place])   04/07/2017 Initial Diagnosis    Palpable right breast lump by referring physician at 1:30 position 2 cm mass, axilla negative, biopsy: Grade 2 IDC ER 95%, PR 90%, Ki-67 10%, HER-2 negative ratio 1.4, T1 CN 0 stage IA clinical stage       Past Medical History:  Diagnosis Date  . Meibomian gland dysfunction     Past Surgical History:  Procedure Laterality Date  . BREAST CYST ASPIRATION    . DILATION AND CURETTAGE OF UTERUS    . TONSILLECTOMY      Social History   Social History  . Marital status: Married    Spouse name: N/A  . Number of children: N/A  . Years of education: N/A   Social History Main Topics  . Smoking status: Never Smoker  . Smokeless tobacco: Never Used  . Alcohol use Yes  . Drug use: No  . Sexual activity: Yes    Birth control/ protection: None   Other Topics Concern  . Not on file   Social History Narrative  . No narrative  on file     FAMILY HISTORY:  We obtained a detailed, 4-generation family history.  Significant diagnoses are listed below: Family History  Problem Relation Age of Onset  . Breast cancer Mother 99  . Heart disease Father   . Kidney failure Father   . Macrosomia Maternal Grandmother   . Macular degeneration Maternal Grandmother   . Arthritis Maternal Grandmother   . Heart disease Maternal Grandfather   . Kidney disease Paternal Grandfather    Ms. Gitto has two daughters, ages 19 and 71. She has a twin sister who has never had cancers.  Ms. Orsini mother is currently 30 and was diagnosed with breast cancer at age 52. Ms. Musolino has one maternal uncle who is 74 without cancers.  Ms. Dobias maternal grandmother died in her 69s and her maternal grandfather died in his 70. Neither had cancers.  Ms. Mealor father died at 69 from kidney and heart failure. Ms. Leite has 2 paternal uncles. One uncle died in an accident at age 52. The other uncle in in his late-70s without cancers. Ms. Santini does not have information regarding her paternal grandparents.  Ms. Weinrich is unaware of previous family history of genetic testing for hereditary cancer risks. Patient's maternal ancestors are of Polish/Italian/german descent, and paternal ancestors are of [place] descent. There is no reported Ashkenazi Jewish ancestry. There is no known consanguinity.  GENETIC COUNSELING ASSESSMENT: Ahni Bradwell is a 41 y.o. female with a personal history of breast cancer at a young age and a family history of breast cancer which is somewhat suggestive of a hereditary cancer syndrome and predisposition to cancer. We, therefore, discussed and recommended the following at today's visit.   DISCUSSION: We reviewed the characteristics, features and inheritance patterns of hereditary cancer syndromes. We also discussed genetic testing, including the appropriate family members to test, the process of  testing, insurance coverage and turn-around-time for results. We discussed the implications of a negative, positive and/or variant of uncertain significant result. In order to get genetic test results in a timely manner so that Ms. Rubey can use these genetic test results for surgical decisions, we recommended Ms. Bleecker pursue genetic testing for the 9-gene High/Moderate Breast Risk STAT Panel offered by [facility]. If this test is negative, we then recommend Ms. Lightle pursue reflex genetic testing to the Common Hereditary Cancer Panel. [facility]'s Common Hereditary Cancers Panel includes analysis of the following 46 genes: APC, ATM, AXIN2, BARD1, BMPR1A, BRCA1, BRCA2, BRIP1, CDH1, CDKN2A, CHEK2, CTNNA1, DICER1, EPCAM, GREM1, HOXB13, KIT, MEN1, MLH1, MSH2, MSH3, MSH6, MUTYH, NBN, NF1, NTHL1, PALB2, PDGFRA, PMS2, POLD1, POLE, PTEN, RAD50, RAD51C, RAD51D, SDHA, SDHB, SDHC, SDHD, SMAD4, SMARCA4, STK11, TP53, TSC1, TSC2, and VHL.  Based on Ms. Bruhn personal and family history of cancer, she meets medical criteria for genetic testing. Despite that she meets criteria, she may still have an out of pocket cost. We discussed that if her out of pocket cost for testing is over $100, the laboratory will call and confirm whether she wants to proceed with testing.  If the out of pocket cost of testing is less than $100 she will be billed by the genetic testing laboratory.   PLAN: After considering the risks, benefits, and limitations, Ms. Witte  provided informed consent to pursue genetic testing and the blood sample was sent to [facility] for analysis of the 9-gene High/Moderate Breast Risk STAT Panel followed by reflex to the 46-gene Common Hereditary Cancer Panel. Results for the STAT panel should be available within approximately 2 weeks' time, at which point they will be disclosed by telephone to Ms. Nebergall, as will any additional recommendations warranted by these results. This information will  also be available in Epic.   Lastly, we encouraged Ms. Huwe to remain in contact with cancer genetics annually so that we can continuously update the family history and inform her of any changes in cancer genetics and testing that may be of benefit for this family.   Ms.  Randle questions were answered to her satisfaction today. Our contact information was provided should additional questions or concerns arise. Thank you for the referral and allowing [place] to share in the care of your patient.   Mal Misty, MS, [facility] Certified Naval [email or web address] phone: [phone]  The patient was seen for a total of 30 minutes in face-to-face genetic counseling.  This patient was discussed with Drs. Magrinat, Lindi Adie and/or Burr Medico who agrees with the above.    _______________________________________________________________________ For Office Staff:  Number of people involved in session: 2 Was an Intern/ student involved with case: no

## 2017-04-21 ENCOUNTER — Telehealth: Payer: Self-pay | Admitting: *Deleted

## 2017-04-21 NOTE — Telephone Encounter (Signed)
  Oncology Nurse Navigator Documentation  Navigator Location: [facility] (04/21/17 1200)   )Navigator Encounter Type: Telephone;[facility] Follow-up (04/21/17 1200) Telephone: Outgoing Call;Clinic/[facility] Follow-up (04/21/17 1200)                                                  Time Spent with Patient: 15 (04/21/17 1200)

## 2017-04-22 ENCOUNTER — Telehealth: Payer: Self-pay | Admitting: Genetics

## 2017-04-22 ENCOUNTER — Encounter (HOSPITAL_BASED_OUTPATIENT_CLINIC_OR_DEPARTMENT_OTHER): Payer: Self-pay | Admitting: *Deleted

## 2017-04-22 ENCOUNTER — Encounter: Payer: Self-pay | Admitting: Genetics

## 2017-04-22 ENCOUNTER — Other Ambulatory Visit: Payer: Self-pay | Admitting: Surgery

## 2017-04-22 DIAGNOSIS — Z170 Estrogen receptor positive status [ER+]: Principal | ICD-10-CM

## 2017-04-22 DIAGNOSIS — C50411 Malignant neoplasm of upper-outer quadrant of right female breast: Secondary | ICD-10-CM

## 2017-04-22 NOTE — Telephone Encounter (Signed)
Error

## 2017-04-22 NOTE — Telephone Encounter (Signed)
Reviewed that the 9-gene high/moderate breast risk STAT panel performed by [facility] was negative for mutations. Will reflex to remaining genes included on [facility]'s 46-gene Common Hereditary Cancer panel. Will call patient when remaining results are available. Final risk assessment and documentation will be made at that time. A portion of her STAT panel results are included below for reference.  

## 2017-04-23 ENCOUNTER — Telehealth: Payer: Self-pay | Admitting: Hematology and Oncology

## 2017-04-23 NOTE — Telephone Encounter (Signed)
Confirmed 6/25 appt at 1000 per sch msg

## 2017-04-27 ENCOUNTER — Telehealth: Payer: Self-pay | Admitting: *Deleted

## 2017-04-27 ENCOUNTER — Ambulatory Visit
Admission: RE | Admit: 2017-04-27 | Discharge: 2017-04-27 | Disposition: A | Discharge status: Home / Self Care | Payer: BLUE CROSS/BLUE SHIELD | Source: Ambulatory Visit | Attending: Surgery | Admitting: Surgery

## 2017-04-27 DIAGNOSIS — Z170 Estrogen receptor positive status [ER+]: Principal | ICD-10-CM

## 2017-04-27 DIAGNOSIS — C50411 Malignant neoplasm of upper-outer quadrant of right female breast: Secondary | ICD-10-CM

## 2017-04-27 NOTE — Progress Notes (Signed)
Pt in to pick up boost breeze, Instructions and arrival time reviewed.

## 2017-04-27 NOTE — Telephone Encounter (Signed)
Called patient to inform of appt. to see Dr. Lisbeth Renshaw on 05-13-17 - 1:30 pm - nurse and 2 pm- Dr. , spoke with patient and she is aware of this appt.

## 2017-04-29 ENCOUNTER — Ambulatory Visit (HOSPITAL_BASED_OUTPATIENT_CLINIC_OR_DEPARTMENT_OTHER): Payer: BLUE CROSS/BLUE SHIELD | Admitting: Anesthesiology

## 2017-04-29 ENCOUNTER — Encounter (HOSPITAL_COMMUNITY)
Admission: RE | Admit: 2017-04-29 | Discharge: 2017-04-29 | Disposition: A | Discharge status: Home / Self Care | Payer: BLUE CROSS/BLUE SHIELD | Source: Ambulatory Visit | Attending: Surgery | Admitting: Surgery

## 2017-04-29 ENCOUNTER — Ambulatory Visit
Admission: RE | Admit: 2017-04-29 | Discharge: 2017-04-29 | Disposition: A | Discharge status: Home / Self Care | Payer: BLUE CROSS/BLUE SHIELD | Source: Ambulatory Visit | Attending: Surgery | Admitting: Surgery

## 2017-04-29 ENCOUNTER — Ambulatory Visit (HOSPITAL_BASED_OUTPATIENT_CLINIC_OR_DEPARTMENT_OTHER)
Admission: RE | Admit: 2017-04-29 | Discharge: 2017-04-29 | Disposition: A | Discharge status: Home / Self Care | Payer: BLUE CROSS/BLUE SHIELD | Source: Ambulatory Visit | Attending: Surgery | Admitting: Surgery

## 2017-04-29 ENCOUNTER — Encounter (HOSPITAL_BASED_OUTPATIENT_CLINIC_OR_DEPARTMENT_OTHER): Payer: Self-pay

## 2017-04-29 ENCOUNTER — Encounter (HOSPITAL_BASED_OUTPATIENT_CLINIC_OR_DEPARTMENT_OTHER)
Admission: RE | Disposition: A | Discharge status: Home / Self Care | Payer: Self-pay | Source: Ambulatory Visit | Attending: Surgery

## 2017-04-29 DIAGNOSIS — Z170 Estrogen receptor positive status [ER+]: Principal | ICD-10-CM

## 2017-04-29 DIAGNOSIS — C50411 Malignant neoplasm of upper-outer quadrant of right female breast: Secondary | ICD-10-CM

## 2017-04-29 DIAGNOSIS — C50911 Malignant neoplasm of unspecified site of right female breast: Secondary | ICD-10-CM | POA: Insufficient documentation

## 2017-04-29 HISTORY — PX: BREAST LUMPECTOMY WITH RADIOACTIVE SEED AND SENTINEL LYMPH NODE BIOPSY: SHX6550

## 2017-04-29 HISTORY — DX: Family history of other specified conditions: Z84.89

## 2017-04-29 HISTORY — DX: Personal history of urinary calculi: Z87.442

## 2017-04-29 SURGERY — BREAST LUMPECTOMY WITH RADIOACTIVE SEED AND SENTINEL LYMPH NODE BIOPSY
Anesthesia: Regional | Site: Breast | Laterality: Right

## 2017-04-29 MED ORDER — OXYCODONE HCL 5 MG PO TABS: 5.0000 mg | ORAL_TABLET | Freq: Four times a day (QID) | ORAL | 0 refills | Status: DC | PRN

## 2017-04-29 MED ORDER — ACETAMINOPHEN 500 MG PO TABS
1000.0000 mg | ORAL_TABLET | ORAL | Status: AC
  Administered 2017-04-29: 1000 mg via ORAL

## 2017-04-29 MED ORDER — FENTANYL CITRATE (PF) 100 MCG/2ML IJ SOLN
INTRAMUSCULAR | Status: AC
  Filled 2017-04-29: qty 2

## 2017-04-29 MED ORDER — PROPOFOL 10 MG/ML IV BOLUS
INTRAVENOUS | Status: DC | PRN
  Administered 2017-04-29: 150 mg via INTRAVENOUS

## 2017-04-29 MED ORDER — OXYCODONE HCL 5 MG PO TABS: 5.0000 mg | ORAL_TABLET | Freq: Once | ORAL | Status: DC | PRN

## 2017-04-29 MED ORDER — PROPOFOL 10 MG/ML IV BOLUS
INTRAVENOUS | Status: AC
  Filled 2017-04-29: qty 20

## 2017-04-29 MED ORDER — ONDANSETRON HCL 4 MG/2ML IJ SOLN
INTRAMUSCULAR | Status: AC
  Filled 2017-04-29: qty 2

## 2017-04-29 MED ORDER — DEXAMETHASONE SODIUM PHOSPHATE 10 MG/ML IJ SOLN
INTRAMUSCULAR | Status: AC
Start: 2017-04-29 — End: ?
  Filled 2017-04-29: qty 1

## 2017-04-29 MED ORDER — CEFAZOLIN SODIUM-DEXTROSE 2-3 GM-% IV SOLR
INTRAVENOUS | Status: DC | PRN
  Administered 2017-04-29: 2 g via INTRAVENOUS

## 2017-04-29 MED ORDER — MIDAZOLAM HCL 2 MG/2ML IJ SOLN
INTRAMUSCULAR | Status: AC
  Filled 2017-04-29: qty 2

## 2017-04-29 MED ORDER — PROMETHAZINE HCL 25 MG/ML IJ SOLN
INTRAMUSCULAR | Status: AC
  Filled 2017-04-29: qty 1

## 2017-04-29 MED ORDER — CEFAZOLIN SODIUM-DEXTROSE 2-4 GM/100ML-% IV SOLN
INTRAVENOUS | Status: AC
  Filled 2017-04-29: qty 100

## 2017-04-29 MED ORDER — ACETAMINOPHEN 500 MG PO TABS
ORAL_TABLET | ORAL | Status: AC
  Filled 2017-04-29: qty 2

## 2017-04-29 MED ORDER — OXYCODONE HCL 5 MG/5ML PO SOLN: 5.0000 mg | Freq: Once | ORAL | Status: DC | PRN

## 2017-04-29 MED ORDER — SODIUM CHLORIDE 0.9 % IR SOLN
Status: DC | PRN
  Administered 2017-04-29: 1000 mL

## 2017-04-29 MED ORDER — BUPIVACAINE-EPINEPHRINE (PF) 0.25% -1:200000 IJ SOLN
INTRAMUSCULAR | Status: DC | PRN
  Administered 2017-04-29: 13 mL

## 2017-04-29 MED ORDER — MIDAZOLAM HCL 2 MG/2ML IJ SOLN
1.0000 mg | INTRAMUSCULAR | Status: DC | PRN
  Administered 2017-04-29: 2 mg via INTRAVENOUS
  Administered 2017-04-29: 1 mg via INTRAVENOUS
  Administered 2017-04-29: 2 mg via INTRAVENOUS

## 2017-04-29 MED ORDER — TECHNETIUM TC 99M SULFUR COLLOID FILTERED
1.0000 | Freq: Once | INTRAVENOUS | Status: AC | PRN
  Administered 2017-04-29: 1 via INTRADERMAL

## 2017-04-29 MED ORDER — FENTANYL CITRATE (PF) 100 MCG/2ML IJ SOLN
INTRAMUSCULAR | Status: AC
Start: 2017-04-29 — End: ?
  Filled 2017-04-29: qty 2

## 2017-04-29 MED ORDER — ONDANSETRON HCL 4 MG/2ML IJ SOLN
INTRAMUSCULAR | Status: DC | PRN
  Administered 2017-04-29: 4 mg via INTRAVENOUS

## 2017-04-29 MED ORDER — CHLORHEXIDINE GLUCONATE CLOTH 2 % EX PADS: 6.0000 | MEDICATED_PAD | Freq: Once | CUTANEOUS | Status: DC

## 2017-04-29 MED ORDER — CELECOXIB 400 MG PO CAPS
400.0000 mg | ORAL_CAPSULE | ORAL | Status: AC
  Administered 2017-04-29: 400 mg via ORAL

## 2017-04-29 MED ORDER — HYDROMORPHONE HCL 1 MG/ML IJ SOLN: 0.2500 mg | INTRAMUSCULAR | Status: DC | PRN

## 2017-04-29 MED ORDER — LIDOCAINE 2% (20 MG/ML) 5 ML SYRINGE
INTRAMUSCULAR | Status: DC | PRN
  Administered 2017-04-29: 60 mg via INTRAVENOUS

## 2017-04-29 MED ORDER — FENTANYL CITRATE (PF) 100 MCG/2ML IJ SOLN
50.0000 ug | INTRAMUSCULAR | Status: AC | PRN
  Administered 2017-04-29: 100 ug via INTRAVENOUS
  Administered 2017-04-29: 50 ug via INTRAVENOUS
  Administered 2017-04-29 (×2): 25 ug via INTRAVENOUS

## 2017-04-29 MED ORDER — DEXAMETHASONE SODIUM PHOSPHATE 10 MG/ML IJ SOLN
INTRAMUSCULAR | Status: DC | PRN
  Administered 2017-04-29: 10 mg via INTRAVENOUS

## 2017-04-29 MED ORDER — MEPERIDINE HCL 25 MG/ML IJ SOLN: 6.2500 mg | INTRAMUSCULAR | Status: DC | PRN

## 2017-04-29 MED ORDER — PROMETHAZINE HCL 25 MG/ML IJ SOLN
6.2500 mg | INTRAMUSCULAR | Status: DC | PRN
  Administered 2017-04-29: 6.25 mg via INTRAVENOUS

## 2017-04-29 MED ORDER — SCOPOLAMINE 1 MG/3DAYS TD PT72: 1.0000 | MEDICATED_PATCH | Freq: Once | TRANSDERMAL | Status: DC | PRN

## 2017-04-29 MED ORDER — LIDOCAINE 2% (20 MG/ML) 5 ML SYRINGE
INTRAMUSCULAR | Status: AC
  Filled 2017-04-29: qty 5

## 2017-04-29 MED ORDER — IBUPROFEN 800 MG PO TABS: 800.0000 mg | ORAL_TABLET | Freq: Three times a day (TID) | ORAL | 0 refills | Status: DC | PRN

## 2017-04-29 MED ORDER — GABAPENTIN 300 MG PO CAPS
300.0000 mg | ORAL_CAPSULE | ORAL | Status: AC
  Administered 2017-04-29: 300 mg via ORAL

## 2017-04-29 MED ORDER — ROPIVACAINE HCL 5 MG/ML IJ SOLN
INTRAMUSCULAR | Status: DC | PRN
  Administered 2017-04-29: 30 mL via PERINEURAL

## 2017-04-29 MED ORDER — GABAPENTIN 300 MG PO CAPS
ORAL_CAPSULE | ORAL | Status: AC
  Filled 2017-04-29: qty 1

## 2017-04-29 MED ORDER — DEXTROSE 5 % IV SOLN: 3.0000 g | INTRAVENOUS | Status: DC

## 2017-04-29 MED ORDER — CELECOXIB 200 MG PO CAPS
ORAL_CAPSULE | ORAL | Status: AC
  Filled 2017-04-29: qty 2

## 2017-04-29 MED ORDER — LACTATED RINGERS IV SOLN
INTRAVENOUS | Status: DC
  Administered 2017-04-29 (×2): via INTRAVENOUS

## 2017-04-29 SURGICAL SUPPLY — 50 items
ADH SKN CLS APL DERMABOND .7 (GAUZE/BANDAGES/DRESSINGS) ×1
APPLIER CLIP 9.375 MED OPEN (MISCELLANEOUS) ×3
APR CLP MED 9.3 20 MLT OPN (MISCELLANEOUS) ×1
BINDER BREAST LRG (GAUZE/BANDAGES/DRESSINGS) ×2 IMPLANT
BINDER BREAST MEDIUM (GAUZE/BANDAGES/DRESSINGS) IMPLANT
BINDER BREAST XLRG (GAUZE/BANDAGES/DRESSINGS) IMPLANT
BINDER BREAST XXLRG (GAUZE/BANDAGES/DRESSINGS) IMPLANT
BLADE SURG 15 STRL LF DISP TIS (BLADE) ×1 IMPLANT
BLADE SURG 15 STRL SS (BLADE) ×3
CANISTER SUC SOCK COL 7IN (MISCELLANEOUS) IMPLANT
CANISTER SUCT 1200ML W/VALVE (MISCELLANEOUS) ×3 IMPLANT
CHLORAPREP W/TINT 26ML (MISCELLANEOUS) ×3 IMPLANT
CLIP APPLIE 9.375 MED OPEN (MISCELLANEOUS) ×1 IMPLANT
COVER BACK TABLE 60X90IN (DRAPES) ×3 IMPLANT
COVER MAYO STAND STRL (DRAPES) ×3 IMPLANT
COVER PROBE W GEL 5X96 (DRAPES) ×3 IMPLANT
DECANTER SPIKE VIAL GLASS SM (MISCELLANEOUS) IMPLANT
DERMABOND ADVANCED (GAUZE/BANDAGES/DRESSINGS) ×2
DERMABOND ADVANCED .7 DNX12 (GAUZE/BANDAGES/DRESSINGS) ×1 IMPLANT
DEVICE DUBIN W/COMP PLATE 8390 (MISCELLANEOUS) ×3 IMPLANT
DRAPE LAPAROSCOPIC ABDOMINAL (DRAPES) ×3 IMPLANT
DRAPE UTILITY XL STRL (DRAPES) ×3 IMPLANT
ELECT COATED BLADE 2.86 ST (ELECTRODE) ×3 IMPLANT
ELECT REM PT RETURN 9FT ADLT (ELECTROSURGICAL) ×3
ELECTRODE REM PT RTRN 9FT ADLT (ELECTROSURGICAL) ×1 IMPLANT
GLOVE BIOGEL PI IND STRL 8 (GLOVE) ×1 IMPLANT
GLOVE BIOGEL PI INDICATOR 8 (GLOVE) ×2
GLOVE ECLIPSE 8.0 STRL XLNG CF (GLOVE) ×3 IMPLANT
GOWN STRL REUS W/ TWL LRG LVL3 (GOWN DISPOSABLE) ×2 IMPLANT
GOWN STRL REUS W/TWL LRG LVL3 (GOWN DISPOSABLE) ×6
HEMOSTAT ARISTA ABSORB 3G PWDR (MISCELLANEOUS) ×2 IMPLANT
HEMOSTAT SNOW SURGICEL 2X4 (HEMOSTASIS) ×3 IMPLANT
KIT MARKER MARGIN INK (KITS) ×3 IMPLANT
NDL HYPO 25X1 1.5 SAFETY (NEEDLE) ×1 IMPLANT
NDL SAFETY ECLIPSE 18X1.5 (NEEDLE) IMPLANT
NEEDLE HYPO 18GX1.5 SHARP (NEEDLE)
NEEDLE HYPO 25X1 1.5 SAFETY (NEEDLE) ×3 IMPLANT
NS IRRIG 1000ML POUR BTL (IV SOLUTION) ×3 IMPLANT
PACK BASIN DAY SURGERY FS (CUSTOM PROCEDURE TRAY) ×3 IMPLANT
PENCIL BUTTON HOLSTER BLD 10FT (ELECTRODE) ×3 IMPLANT
SLEEVE SCD COMPRESS KNEE MED (MISCELLANEOUS) ×3 IMPLANT
SPONGE LAP 4X18 X RAY DECT (DISPOSABLE) ×3 IMPLANT
SUT MNCRL AB 4-0 PS2 18 (SUTURE) ×3 IMPLANT
SUT VICRYL 3-0 CR8 SH (SUTURE) ×3 IMPLANT
SYR CONTROL 10ML LL (SYRINGE) ×3 IMPLANT
TOWEL OR 17X24 6PK STRL BLUE (TOWEL DISPOSABLE) ×3 IMPLANT
TOWEL OR NON WOVEN STRL DISP B (DISPOSABLE) ×3 IMPLANT
TUBE CONNECTING 20'X1/4 (TUBING) ×1
TUBE CONNECTING 20X1/4 (TUBING) ×2 IMPLANT
YANKAUER SUCT BULB TIP NO VENT (SUCTIONS) ×3 IMPLANT

## 2017-04-29 NOTE — Anesthesia Procedure Notes (Signed)
Anesthesia Regional Block: Pectoralis block   Pre-Anesthetic Checklist: ,, timeout performed, Correct Patient, Correct Site, Correct Laterality, Correct Procedure, Correct Position, site marked, Risks and benefits discussed,  Surgical consent,  Pre-op evaluation,  At surgeon's request and post-op pain management  Laterality: Right  Prep: Dura Prep       Needles:  Injection technique: Single-shot  Needle Type: Echogenic Stimulator Needle     Needle Length: 10cm  Needle Gauge: 21     Additional Needles:   Procedures: ultrasound guided,,,,,,,,  Narrative:  Start time: 04/29/2017 12:26 PM End time: 04/29/2017 12:27 PM Injection made incrementally with aspirations every 5 mL.  Performed by: Personally  Anesthesiologist: Candida Peeling RAY

## 2017-04-29 NOTE — Anesthesia Preprocedure Evaluation (Signed)
Anesthesia Evaluation  Patient identified by MRN, date of birth, ID band Patient awake    Reviewed: Allergy & Precautions, NPO status , Patient's Chart, lab work & pertinent test results  Airway Mallampati: II  TM Distance: >3 FB Neck ROM: Full    Dental no notable dental hx.    Pulmonary neg pulmonary ROS,    Pulmonary exam normal breath sounds clear to auscultation       Cardiovascular negative cardio ROS Normal cardiovascular exam Rhythm:Regular Rate:Normal     Neuro/Psych negative neurological ROS  negative psych ROS   GI/Hepatic negative GI ROS, Neg liver ROS,   Endo/Other  negative endocrine ROS  Renal/GU negative Renal ROS  negative genitourinary   Musculoskeletal negative musculoskeletal ROS (+)   Abdominal   Peds negative pediatric ROS (+)  Hematology negative hematology ROS (+)   Anesthesia Other Findings   Reproductive/Obstetrics Breast Cancer                             Anesthesia Physical Anesthesia Plan  ASA: II  Anesthesia Plan: General and Regional   Post-op Pain Management: GA combined w/ Regional for post-op pain   Induction: Intravenous  PONV Risk Score and Plan: 4 or greater and Ondansetron, Dexamethasone, Propofol, Midazolam, Scopolamine patch - Pre-op and Treatment may vary due to age  Airway Management Planned:   Additional Equipment:   Intra-op Plan:   Post-operative Plan: Extubation in OR  Informed Consent: I have reviewed the patients History and Physical, chart, labs and discussed the procedure including the risks, benefits and alternatives for the proposed anesthesia with the patient or authorized representative who has indicated his/her understanding and acceptance.   Dental advisory given  Plan Discussed with: CRNA  Anesthesia Plan Comments:         Anesthesia Quick Evaluation

## 2017-04-29 NOTE — Anesthesia Procedure Notes (Signed)
Procedure Name: LMA Insertion Date/Time: 04/29/2017 1:12 PM Performed by: Genelle Bal Pre-anesthesia Checklist: Patient identified, Emergency Drugs available, Suction available and Patient being monitored Patient Re-evaluated:Patient Re-evaluated prior to inductionOxygen Delivery Method: Circle system utilized Preoxygenation: Pre-oxygenation with 100% oxygen Intubation Type: IV induction Ventilation: Mask ventilation without difficulty LMA: LMA inserted LMA Size: 4.0 Number of attempts: 1 Placement Confirmation: positive ETCO2 and breath sounds checked- equal and bilateral Tube secured with: Tape Dental Injury: Teeth and Oropharynx as per pre-operative assessment

## 2017-04-29 NOTE — Progress Notes (Signed)
Assisted Dr. Miller with right, ultrasound guided, pectoralis block. Side rails up, monitors on throughout procedure. See vital signs in flow sheet. Tolerated Procedure well. 

## 2017-04-29 NOTE — Anesthesia Postprocedure Evaluation (Signed)
Anesthesia Post Note  Patient: Carla Velez  Procedure(s) Performed: Procedure(s) (LRB): BREAST LUMPECTOMY WITH RADIOACTIVE SEED AND SENTINEL LYMPH NODE BIOPSY (Right)     Patient location during evaluation: PACU Anesthesia Type: Regional Level of consciousness: awake and alert Pain management: pain level controlled Vital Signs Assessment: post-procedure vital signs reviewed and stable Respiratory status: spontaneous breathing, nonlabored ventilation and respiratory function stable Cardiovascular status: blood pressure returned to baseline and stable Postop Assessment: no signs of nausea or vomiting Anesthetic complications: no    Last Vitals:  Vitals:   04/29/17 1500 04/29/17 1515  BP: 112/79 108/71  Pulse: 96 65  Resp: 14 14  Temp:      Last Pain:  Vitals:   04/29/17 1515  TempSrc:   PainSc: [place]

## 2017-04-29 NOTE — Transfer of Care (Signed)
Immediate Anesthesia Transfer of Care Note  Patient: Carla Velez  Procedure(s) Performed: Procedure(s): BREAST LUMPECTOMY WITH RADIOACTIVE SEED AND SENTINEL LYMPH NODE BIOPSY (Right)  Patient Location: PACU  Anesthesia Type:General  Level of Consciousness: drowsy and patient cooperative  Airway & Oxygen Therapy: Patient Spontanous Breathing and Patient connected to face mask oxygen  Post-op Assessment: Report given to RN and Post -op Vital signs reviewed and stable  Post vital signs: Reviewed and stable  Last Vitals:  Vitals:   04/29/17 1255 04/29/17 1426  BP:  (P) 109/64  Pulse: (!) 58 72  Resp: 12 19  Temp:  (P) 36.4 C    Last Pain:  Vitals:   04/29/17 1105  TempSrc: Oral         Complications: No apparent anesthesia complications

## 2017-04-29 NOTE — Interval H&P Note (Signed)
History and Physical Interval Note:  04/29/2017 12:55 PM  Carla Velez  has presented today for surgery, with the diagnosis of RIGHT BREAST CANCER  The various methods of treatment have been discussed with the patient and family. After consideration of risks, benefits and other options for treatment, the patient has consented to  Procedure(s): BREAST LUMPECTOMY WITH RADIOACTIVE SEED AND SENTINEL LYMPH NODE BIOPSY (Right) as a surgical intervention .  The patient's history has been reviewed, patient examined, no change in status, stable for surgery.  I have reviewed the patient's chart and labs.  Questions were answered to the patient's satisfaction.     Florabel Faulks A.

## 2017-04-29 NOTE — Op Note (Signed)
Preoperative diagnosis: Stage I right breast cancer  Postoperative diagnosis: Same  Procedure: Right breast seed localized partial mastectomy with right axillary sentinel lymph node mapping  Surgeon: Erroll Luna M.D.  Anesthesia: LMA with pectoral block and local anesthetic  EBL: 30 mL  Specimens: Right breast tissue with eating clip in specimen  Two  right axillary sentinel nodes to pathology  Indications for procedure: The patient is a 41 -year-old female with stage I right breast cancer. She is up to breast conservation with sentinel lymph node mapping. The procedure has been discussed with the patient. Alternatives to surgery have been discussed with the patient.  Risks of surgery include bleeding,  Infection,  Seroma formation, death,  and the need for further surgery.   The patient understands and wishes to proceed.Sentinel lymph node mapping and dissection has been discussed with the patient.  Risk of bleeding,  Infection,  Seroma formation,  Additional procedures,,  Shoulder weakness ,  Shoulder stiffness,  Nerve and blood vessel injury and reaction to the mapping dyes have been discussed.  Alternatives to surgery have been discussed with the patient.  The patient agrees to proceed.  Description of procedure: The patient underwent seed placement radiology for right breast cancer. She underwent injection with technetium by radiology in the holding area today. Her questions are answered the right side was marked with a correct side. She received a pectoral block by anesthesia. She was taken back to the operating room placed upon the OR table. After induction of general anesthesia the right breast was prepped and draped in a sterile fashion. Timeout was done. She received preoperative antibiotics. The neoprobe was used the hotspot was localized in the central right breast. Curvilinear incision was made along the medial border the nipple areolar complex and dissection was carried down  behind the nipple. These seem clip are identified with a neoprobe excised in their entirety with grossly negative margins. Radiograph revealed the specimen to be intact with proceeding clip in specimen. The specimen was oriented and sent to pathology where they received the tissue and seed. This was verified. We was irrigated. Hemostasis was achieved.Avista   was placed in the cavity. The cavity was clipped and closed with 3-0 Vicryl and 4-0 Monocryl.  The right sono was done next. Neoprobe was used and hotspot identified in the right axilla. A 3 cm incision was made in the right axillary hairline and dissection was carried into the right axilla contents. There were 2 hot sentinel nodes identified which are level I lymph nodes. These were deep. Hemostasis was achieved with clips and the application of arista.  Long thoracic nerve, thoracodorsal trunk and axillary vein were all preserved. Background counts approached baseline. Wound closed with 3-0 Vicryl and 4-0 Monocryl. Dermabond applied a breast binder placed. All final counts are found to be correct. The patient was awoke axillary taken to recovery in satisfactory condition.

## 2017-04-29 NOTE — H&P (View-Only) (Signed)
Carla Velez 04/14/2017 7:26 AM Location: [facility] Patient #: [id number] DOB: January 22, 1976 Undefined / Language: Cleophus Molt / Race: White Female  History of Present Illness Carla Moores A. Damauri Minion MD; 04/14/2017 11:20 AM) Patient words: patient sent at the request of Dr Owens Shark fter the patientnoticed a lump in her right medial breast. This was picked up by her primary care doctor as well. Imaging showed a 2 cm mass location 1:30 right breast upper inner quadrant with a negative appearing axilla core biopsy gradesitinvasive ductal carcinoma ER positive PR positive HER-2/neu negative. No family history of breast cancer. The patient had a previous right breast cyst. This was aspirated 2 years ago. Patienge on other problem with her brer brts.le discharge or other problem with her brer breast          CLINICAL DATA: Patient presents for diagnostic right breast examination due to a palpable abnormality felt by her healthcare provider over the upper inner right periareolar region.  EXAM: 2D DIGITAL DIAGNOSTIC right MAMMOGRAM WITH CAD AND ADJUNCT TOMO  ULTRASOUND right BREAST  COMPARISON: Previous exam(s).  ACR Breast Density Category c: The breast tissue is heterogeneously dense, which may obscure small masses.  FINDINGS: Examination demonstrates no definite focal abnormality over the inner upper right periareolar region to correspond to patient's palpable abnormality. Remainder of the exam is unchanged.  Mammographic images were processed with CAD.  On physical exam, I palpate a 1 cm fixed mass at the 1:30 position of the right breast 2 cm from the nipple.  Targeted ultrasound is performed, showing a hypoechoic mass with irregular shape and margins at the 1:30 position 2 cm from the nipple corresponding to patient's palpable abnormality and measuring 1.2 x 1.3 x 2.0 cm.  Ultrasound of the right axilla is within normal.  IMPRESSION: Suspicious mass over the 1:30  position of the right breast 2 cm from the nipple measuring 1.2 x 1.3 x 2.0 cm corresponding to patient's palpable abnormality.  RECOMMENDATION: Recommend ultrasound-guided core needle biopsy for further evaluation.  I have discussed the findings and recommendations with the patient. Results were also provided in writing at the conclusion of the visit. If applicable, a reminder letter will be sent to the patient regarding the next appointment.  BI-RADS CATEGORY 4: Suspicious.  Patient will be scheduled for biopsy prior to her departure from the [place].   Electronically Signed By: Marin Olp M.D. On: 04/05/2017 16:06               ADDITIONAL INFORMATION: PROGNOSTIC INDICATORS Results: IMMUNOHISTOCHEMICAL AND MORPHOMETRIC ANALYSIS PERFORMED MANUALLY Estrogen Receptor: 95%, POSITIVE, STRONG STAINING INTENSITY Progesterone Receptor: 90%, POSITIVE, STRONG STAINING INTENSITY Proliferation Marker Ki67: 10% REFERENCE RANGE ESTROGEN RECEPTOR NEGATIVE 0% POSITIVE =>1% REFERENCE RANGE PROGESTERONE RECEPTOR NEGATIVE 0% POSITIVE =>1% All controls stained appropriately Enid Cutter MD Pathologist, Electronic Signature ( Signed 04/13/2017) FLUORESCENCE IN-SITU HYBRIDIZATION Results: HER2 - NEGATIVE RATIO OF HER2/CEP17 SIGNALS 1.40 AVERAGE HER2 COPY NUMBER PER CELL 2.10 Reference Range: NEGATIVE HER2/CEP17 Ratio <2.0 and average HER2 copy number <4.0 EQUIVOCAL HER2/CEP17 Ratio <2.0 and average HER2 copy number >=4.0 and <6.0 1 of 3 FINAL for Ransom, Sharde ([id number]) ADDITIONAL INFORMATION:(continued) POSITIVE HER2/CEP17 Ratio >=2.0 or <2.0 and average HER2 copy number >=6.0 Claudette Laws MD Pathologist, Electronic Signature ( Signed 04/12/2017) FINAL DIAGNOSIS Diagnosis Breast, right, needle core biopsy, 1:30 o'clock - INVASIVE DUCTAL CARCINOMA - SEE COMMENT Microscopic Comment Based on the biopsy, the carcinoma appears Nottingham grade 2 of  3 and measures 1.1 cm in greatest linear  extent. Prognostic markers (ER/PR/HER2-FISH) are pending and will be reported in an addendum. Dr. Saralyn Pilar has reviewed the case and agrees with above diagnosis. These.  The patient is a 41 year old female.   Past Surgical History Conni Slipper, RN; 04/14/2017 7:26 AM) Breast Biopsy Right. Tonsillectomy  Diagnostic Studies History Conni Slipper, RN; 04/14/2017 7:26 AM) Colonoscopy never Mammogram within last year Pap Smear 1-5 years ago  Medication History Conni Slipper, RN; 04/14/2017 7:26 AM) Medications Reconciled  Social History Conni Slipper, RN; 04/14/2017 7:26 AM) Alcohol use Occasional alcohol use. Caffeine use Coffee. No drug use Tobacco use Never smoker.  Family History Conni Slipper, RN; 04/14/2017 7:26 AM) Alcohol Abuse Family Members In General. Anesthetic complications Family Members In General. Arthritis Family Members In General, Mother. Breast Cancer Mother. Cerebrovascular Accident Family Members In General. Depression Mother. Heart Disease Family Members In General, Father. Heart disease in female family member before age 62 Hypertension Family Members In General, Father. Kidney Disease Father. Migraine Headache Mother.  Pregnancy / Birth History Conni Slipper, RN; 04/14/2017 7:26 AM) Age at menarche 72 years. Contraceptive History Intrauterine device, Oral contraceptives. Gravida 4 Length (months) of breastfeeding 7-12 Maternal age 56-20 Para 2 Regular periods  Other Problems Conni Slipper, RN; 04/14/2017 7:26 AM) Anxiety Disorder Back Pain Hemorrhoids Kidney Stone Lump In Breast Migraine Headache     Review of Systems Conni Slipper RN; 04/14/2017 7:26 AM) General Present- Appetite Loss and Night Sweats. Not Present- Chills, Fatigue, Fever, Weight Gain and Weight Loss. Skin Present- Rash. Not Present- Change in Wart/Mole, Dryness, Hives, Jaundice, New Lesions, Non-Healing Wounds and  Ulcer. HEENT Present- Seasonal Allergies and Wears glasses/contact lenses. Not Present- Earache, Hearing Loss, Hoarseness, Nose Bleed, Oral Ulcers, Ringing in the Ears, Sinus Pain, Sore Throat, Visual Disturbances and Yellow Eyes. Respiratory Not Present- Bloody sputum, Chronic Cough, Difficulty Breathing, Snoring and Wheezing. Breast Present- Breast Mass. Not Present- Breast Pain, Nipple Discharge and Skin Changes. Cardiovascular Not Present- Chest Pain, Difficulty Breathing Lying Down, Leg Cramps, Palpitations, Rapid Heart Rate, Shortness of Breath and Swelling of Extremities. Gastrointestinal Present- Hemorrhoids. Not Present- Abdominal Pain, Bloating, Bloody Stool, Change in Bowel Habits, Chronic diarrhea, Constipation, Difficulty Swallowing, Excessive gas, Gets full quickly at meals, Indigestion, Nausea, Rectal Pain and Vomiting. Female Genitourinary Not Present- Frequency, Nocturia, Painful Urination, Pelvic Pain and Urgency. Musculoskeletal Not Present- Back Pain, Joint Pain, Joint Stiffness, Muscle Pain, Muscle Weakness and Swelling of Extremities. Neurological Present- Numbness. Not Present- Decreased Memory, Fainting, Headaches, Seizures, Tingling, Tremor, Trouble walking and Weakness. Psychiatric Present- Anxiety and Change in Sleep Pattern. Not Present- Bipolar, Depression, Fearful and Frequent crying. Endocrine Not Present- Cold Intolerance, Excessive Hunger, Hair Changes, Heat Intolerance, Hot flashes and New Diabetes. Hematology Not Present- Blood Thinners, Easy Bruising, Excessive bleeding, Gland problems, HIV and Persistent Infections.   Physical Exam (Carla Partch A. Ghazi Rumpf MD; 04/14/2017 11:21 AM)  General Mental Status-Alert. General Appearance-Consistent with stated age. Hydration-Well hydrated. Voice-Normal.  Head and Neck Head-normocephalic, atraumatic with no lesions or palpable masses.  Eye Eyeball - Bilateral-Extraocular movements intact. Sclera/Conjunctiva  - Bilateral-No scleral icterus.  Chest and Lung Exam Chest and lung exam reveals -quiet, even and easy respiratory effort with no use of accessory muscles and on auscultation, normal breath sounds, no adventitious sounds and normal vocal resonance. Inspection Chest Wall - Normal. Back - normal.  Breast Note: bruising right breast notedover the medial aspect.  Left breast normal. No masses in either breast.  Cardiovascular Cardiovascular examination reveals -on palpation PMI is normal in location  and amplitude, no palpable S3 or S4. Normal cardiac borders., normal heart sounds, regular rate and rhythm with no murmurs, carotid auscultation reveals no bruits and normal pedal pulses bilaterally.  Abdomen Inspection Inspection of the abdomen reveals - No Hernias. Skin - Scar - no surgical scars. Palpation/Percussion Palpation and Percussion of the abdomen reveal - Soft, Non Tender, No Rebound tenderness, No Rigidity (guarding) and No hepatosplenomegaly. Auscultation Auscultation of the abdomen reveals - Bowel sounds normal.  Neurologic Neurologic evaluation reveals -alert and oriented x 3 with no impairment of recent or remote memory. Mental Status-Normal.  Musculoskeletal Normal Exam - Left-Upper Extremity Strength Normal and Lower Extremity Strength Normal. Normal Exam - Right-Upper Extremity Strength Normal, Lower Extremity Weakness.    Assessment & Plan (Carla Limbaugh A. Essence Merle MD; 04/14/2017 11:22 AM)  BREAST CANCER, RIGHT (C50.911) Impression: discussed odtiononstruction.conservation versus mastectomy and reconstruction. The patient will be genetic testing. Patient hright axillary sentinel lymph node mapping.y with right axillary sentinel lymph node mapping. Discussed expectations and cosmetic outcomes. Risk of lumpectomy include bleeding, infection, seroma, more surgery, use of seed/wire, wound care, cosmetic deformity and the need for other treatments, death , blood  clots, death. Pt agrees to proceed. Risk of sentinel lymph node mapping include bleeding, infection, lymphedema, shoulder pain. stiffness, dye allergy. cosmetic deformity , blood clots, death, need for more surgery. Pt agres to proceed.  Current Plans You are being scheduled for surgery- Our schedulers will call you.  You should hear from our office's scheduling department within 5 working days about the location, date, and time of surgery. We try to make accommodations for patient's preferences in scheduling surgery, but sometimes the OR schedule or the surgeon's schedule prevents [place] from making those accommodations.  If you have not heard from our office [phone]) in 5 working days, call the office and ask for your surgeon's nurse.  If you have other questions about your diagnosis, plan, or surgery, call the office and ask for your surgeon's nurse.  Pt Education - [facility] Breast Cancer Information Given - [facility] "Breast Journey" Package Pt Education - Pamphlet Given - Breast Biopsy: discussed with patient and provided information. We discussed the staging and pathophysiology of breast cancer. We discussed all of the different options for treatment for breast cancer including surgery, chemotherapy, radiation therapy, Herceptin, and antiestrogen therapy. We discussed a sentinel lymph node biopsy as she does not appear to having lymph node involvement right now. We discussed the performance of that with injection of radioactive tracer and blue dye. We discussed that she would have an incision underneath her axillary hairline. We discussed that there is a bout a 10-20% chance of having a positive node with a sentinel lymph node biopsy and we will await the permanent pathology to make any other first further decisions in terms of her treatment. One of these options might be to return to the operating room to perform an axillary lymph node dissection. We discussed about a 1-2% risk lifetime of chronic  shoulder pain as well as lymphedema associated with a sentinel lymph node biopsy. We discussed the options for treatment of the breast cancer which included lumpectomy versus a mastectomy. We discussed the performance of the lumpectomy with a wire placement. We discussed a 10-20% chance of a positive margin requiring reexcision in the operating room. We also discussed that she may need radiation therapy or antiestrogen therapy or both if she undergoes lumpectomy. We discussed the mastectomy and the postoperative care for that as well. We discussed that there  is no difference in her survival whether she undergoes lumpectomy with radiation therapy or antiestrogen therapy versus a mastectomy. There is a slight difference in the local recurrence rate being 3-5% with lumpectomy and about 1% with a mastectomy. We discussed the risks of operation including bleeding, infection, possible reoperation. She understands her further therapy will be based on what her stages at the time of her operation.  Pt Education - [facility] breast cancer surgery: discussed with patient and provided information. Pt Education - ABC (After Breast Cancer) Class Info: discussed with patient and provided information.

## 2017-04-29 NOTE — Discharge Instructions (Signed)
[facility] °Office Phone Number [phone] ° °BREAST BIOPSY/ PARTIAL MASTECTOMY: POST OP INSTRUCTIONS ° °Always review your discharge instruction sheet given to you by the facility where your surgery was performed. ° °IF YOU HAVE DISABILITY OR FAMILY LEAVE FORMS, YOU MUST BRING THEM TO THE OFFICE FOR PROCESSING.  DO NOT GIVE THEM TO YOUR DOCTOR. ° °1. A prescription for pain medication may be given to you upon discharge.  Take your pain medication as prescribed, if needed.  If narcotic pain medicine is not needed, then you may take acetaminophen (Tylenol) or ibuprofen (Advil) as needed. °2. Take your usually prescribed medications unless otherwise directed °3. If you need a refill on your pain medication, please contact your pharmacy.  They will contact our office to request authorization.  Prescriptions will not be filled after 5pm or on week-ends. °4. You should eat very light the first 24 hours after surgery, such as soup, crackers, pudding, etc.  Resume your normal diet the day after surgery. °5. Most patients will experience some swelling and bruising in the breast.  Ice packs and a good support bra will help.  Swelling and bruising can take several days to resolve.  °6. It is common to experience some constipation if taking pain medication after surgery.  Increasing fluid intake and taking a stool softener will usually help or prevent this problem from occurring.  A mild laxative (Milk of Magnesia or Miralax) should be taken according to package directions if there are no bowel movements after 48 hours. °7. Unless discharge instructions indicate otherwise, you may remove your bandages 24-48 hours after surgery, and you may shower at that time.  You may have steri-strips (small skin tapes) in place directly over the incision.  These strips should be left on the skin for 7-10 days.  If your surgeon used skin glue on the incision, you may shower in 24 hours.  The glue will flake off over the  next 2-3 weeks.  Any sutures or staples will be removed at the office during your follow-up visit. °8. ACTIVITIES:  You may resume regular daily activities (gradually increasing) beginning the next day.  Wearing a good support bra or sports bra minimizes pain and swelling.  You may have sexual intercourse when it is comfortable. °a. You may drive when you no longer are taking prescription pain medication, you can comfortably wear a seatbelt, and you can safely maneuver your car and apply brakes. °b. RETURN TO WORK:  ______________________________________________________________________________________ °9. You should see your doctor in the office for a follow-up appointment approximately two weeks after your surgery.  Your doctor’s nurse will typically make your follow-up appointment when she calls you with your pathology report.  Expect your pathology report 2-3 business days after your surgery.  You may call to check if you do not hear from us after three days. °10. OTHER INSTRUCTIONS: _______________________________________________________________________________________________ _____________________________________________________________________________________________________________________________________ °_____________________________________________________________________________________________________________________________________ °_____________________________________________________________________________________________________________________________________ ° °WHEN TO CALL YOUR DOCTOR: °1. Fever over 101.0 °2. Nausea and/or vomiting. °3. Extreme swelling or bruising. °4. Continued bleeding from incision. °5. Increased pain, redness, or drainage from the incision. ° °The clinic staff is available to answer your questions during regular business hours.  Please don’t hesitate to call and ask to speak to one of the nurses for clinical concerns.  If you have a medical emergency, go to the nearest  emergency room or call 911.  A surgeon from [facility] is always on call at the hospital. ° °For further questions, please visit [email or web address]  ° ° ° ° °  Post Anesthesia Home Care Instructions ° °Activity: °Get plenty of rest for the remainder of the day. A responsible individual must stay with you for 24 hours following the procedure.  °For the next 24 hours, DO NOT: °-Drive a car °-Operate machinery °-Drink alcoholic beverages °-Take any medication unless instructed by your physician °-Make any legal decisions or sign important papers. ° °Meals: °Start with liquid foods such as gelatin or soup. Progress to regular foods as tolerated. Avoid greasy, spicy, heavy foods. If nausea and/or vomiting occur, drink only clear liquids until the nausea and/or vomiting subsides. Call your physician if vomiting continues. ° °Special Instructions/Symptoms: °Your throat may feel dry or sore from the anesthesia or the breathing tube placed in your throat during surgery. If this causes discomfort, gargle with warm salt water. The discomfort should disappear within 24 hours. ° °If you had a scopolamine patch placed behind your ear for the management of post- operative nausea and/or vomiting: ° °1. The medication in the patch is effective for 72 hours, after which it should be removed.  Wrap patch in a tissue and discard in the trash. Wash hands thoroughly with soap and water. °2. You may remove the patch earlier than 72 hours if you experience unpleasant side effects which may include dry mouth, dizziness or visual disturbances. °3. Avoid touching the patch. Wash your hands with soap and water after contact with the patch. °  ° °

## 2017-04-30 ENCOUNTER — Encounter (HOSPITAL_BASED_OUTPATIENT_CLINIC_OR_DEPARTMENT_OTHER): Payer: Self-pay | Admitting: Surgery

## 2017-05-03 ENCOUNTER — Ambulatory Visit: Payer: Self-pay | Admitting: Surgery

## 2017-05-04 ENCOUNTER — Encounter (HOSPITAL_BASED_OUTPATIENT_CLINIC_OR_DEPARTMENT_OTHER): Payer: Self-pay | Admitting: *Deleted

## 2017-05-07 ENCOUNTER — Encounter (HOSPITAL_BASED_OUTPATIENT_CLINIC_OR_DEPARTMENT_OTHER): Payer: Self-pay | Admitting: *Deleted

## 2017-05-10 ENCOUNTER — Ambulatory Visit (HOSPITAL_BASED_OUTPATIENT_CLINIC_OR_DEPARTMENT_OTHER): Payer: BLUE CROSS/BLUE SHIELD | Admitting: Anesthesiology

## 2017-05-10 ENCOUNTER — Encounter (HOSPITAL_BASED_OUTPATIENT_CLINIC_OR_DEPARTMENT_OTHER)
Admission: RE | Disposition: A | Discharge status: Home / Self Care | Payer: Self-pay | Source: Ambulatory Visit | Attending: Surgery

## 2017-05-10 ENCOUNTER — Ambulatory Visit (HOSPITAL_BASED_OUTPATIENT_CLINIC_OR_DEPARTMENT_OTHER)
Admission: RE | Admit: 2017-05-10 | Discharge: 2017-05-10 | Disposition: A | Discharge status: Home / Self Care | Payer: BLUE CROSS/BLUE SHIELD | Source: Ambulatory Visit | Attending: Surgery | Admitting: Surgery

## 2017-05-10 ENCOUNTER — Encounter (HOSPITAL_BASED_OUTPATIENT_CLINIC_OR_DEPARTMENT_OTHER): Payer: Self-pay | Admitting: Anesthesiology

## 2017-05-10 ENCOUNTER — Telehealth: Payer: Self-pay | Admitting: Genetics

## 2017-05-10 DIAGNOSIS — Z79899 Other long term (current) drug therapy: Secondary | ICD-10-CM | POA: Insufficient documentation

## 2017-05-10 DIAGNOSIS — C50911 Malignant neoplasm of unspecified site of right female breast: Secondary | ICD-10-CM | POA: Diagnosis not present

## 2017-05-10 HISTORY — PX: RE-EXCISION OF BREAST LUMPECTOMY: SHX6048

## 2017-05-10 SURGERY — EXCISION, LESION, BREAST
Anesthesia: General | Site: Breast | Laterality: Right

## 2017-05-10 MED ORDER — PROMETHAZINE HCL 25 MG/ML IJ SOLN: 6.2500 mg | INTRAMUSCULAR | Status: DC | PRN

## 2017-05-10 MED ORDER — OXYCODONE HCL 5 MG PO TABS
5.0000 mg | ORAL_TABLET | Freq: Four times a day (QID) | ORAL | 0 refills | Status: DC | PRN
Start: 2017-05-10 — End: 2018-03-01

## 2017-05-10 MED ORDER — ONDANSETRON HCL 4 MG/2ML IJ SOLN
INTRAMUSCULAR | Status: AC
  Filled 2017-05-10: qty 2

## 2017-05-10 MED ORDER — DEXAMETHASONE SODIUM PHOSPHATE 10 MG/ML IJ SOLN
INTRAMUSCULAR | Status: AC
  Filled 2017-05-10: qty 1

## 2017-05-10 MED ORDER — BUPIVACAINE-EPINEPHRINE 0.25% -1:200000 IJ SOLN
INTRAMUSCULAR | Status: DC | PRN
  Administered 2017-05-10: 10 mL

## 2017-05-10 MED ORDER — EPHEDRINE SULFATE 50 MG/ML IJ SOLN
INTRAMUSCULAR | Status: DC | PRN
  Administered 2017-05-10: 15 mg via INTRAVENOUS
  Administered 2017-05-10: 10 mg via INTRAVENOUS

## 2017-05-10 MED ORDER — LIDOCAINE HCL (CARDIAC) 20 MG/ML IV SOLN
INTRAVENOUS | Status: DC | PRN
  Administered 2017-05-10: 30 mg via INTRAVENOUS

## 2017-05-10 MED ORDER — KETOROLAC TROMETHAMINE 30 MG/ML IJ SOLN: 30.0000 mg | Freq: Once | INTRAMUSCULAR | Status: DC | PRN

## 2017-05-10 MED ORDER — PHENYLEPHRINE HCL 10 MG/ML IJ SOLN
INTRAMUSCULAR | Status: DC | PRN
  Administered 2017-05-10 (×2): 80 ug via INTRAVENOUS

## 2017-05-10 MED ORDER — FENTANYL CITRATE (PF) 100 MCG/2ML IJ SOLN
INTRAMUSCULAR | Status: AC
  Filled 2017-05-10: qty 2

## 2017-05-10 MED ORDER — FENTANYL CITRATE (PF) 100 MCG/2ML IJ SOLN
25.0000 ug | INTRAMUSCULAR | Status: DC | PRN
  Administered 2017-05-10 (×2): 25 ug via INTRAVENOUS

## 2017-05-10 MED ORDER — ONDANSETRON HCL 4 MG/2ML IJ SOLN
INTRAMUSCULAR | Status: DC | PRN
  Administered 2017-05-10: 4 mg via INTRAVENOUS

## 2017-05-10 MED ORDER — EPHEDRINE 5 MG/ML INJ
INTRAVENOUS | Status: AC
  Filled 2017-05-10: qty 10

## 2017-05-10 MED ORDER — MIDAZOLAM HCL 2 MG/2ML IJ SOLN: 1.0000 mg | INTRAMUSCULAR | Status: DC | PRN

## 2017-05-10 MED ORDER — FENTANYL CITRATE (PF) 100 MCG/2ML IJ SOLN
INTRAMUSCULAR | Status: DC | PRN
  Administered 2017-05-10: 100 ug via INTRAVENOUS

## 2017-05-10 MED ORDER — SCOPOLAMINE 1 MG/3DAYS TD PT72: 1.0000 | MEDICATED_PATCH | Freq: Once | TRANSDERMAL | Status: DC | PRN

## 2017-05-10 MED ORDER — DEXAMETHASONE SODIUM PHOSPHATE 4 MG/ML IJ SOLN
INTRAMUSCULAR | Status: DC | PRN
  Administered 2017-05-10: 10 mg via INTRAVENOUS

## 2017-05-10 MED ORDER — CHLORHEXIDINE GLUCONATE CLOTH 2 % EX PADS: 6.0000 | MEDICATED_PAD | Freq: Once | CUTANEOUS | Status: DC

## 2017-05-10 MED ORDER — LACTATED RINGERS IV SOLN
INTRAVENOUS | Status: DC
  Administered 2017-05-10 (×2): via INTRAVENOUS

## 2017-05-10 MED ORDER — PROPOFOL 10 MG/ML IV BOLUS
INTRAVENOUS | Status: AC
  Filled 2017-05-10: qty 20

## 2017-05-10 MED ORDER — PROPOFOL 10 MG/ML IV BOLUS
INTRAVENOUS | Status: DC | PRN
  Administered 2017-05-10: 150 mg via INTRAVENOUS

## 2017-05-10 MED ORDER — CEFAZOLIN SODIUM-DEXTROSE 2-4 GM/100ML-% IV SOLN
INTRAVENOUS | Status: AC
  Filled 2017-05-10: qty 100

## 2017-05-10 MED ORDER — CEFAZOLIN SODIUM-DEXTROSE 2-4 GM/100ML-% IV SOLN
2.0000 g | INTRAVENOUS | Status: AC
  Administered 2017-05-10: 2 g via INTRAVENOUS

## 2017-05-10 MED ORDER — LIDOCAINE 2% (20 MG/ML) 5 ML SYRINGE
INTRAMUSCULAR | Status: AC
  Filled 2017-05-10: qty 5

## 2017-05-10 MED ORDER — MIDAZOLAM HCL 5 MG/5ML IJ SOLN
INTRAMUSCULAR | Status: DC | PRN
  Administered 2017-05-10: 2 mg via INTRAVENOUS

## 2017-05-10 MED ORDER — PHENYLEPHRINE 40 MCG/ML (10ML) SYRINGE FOR IV PUSH (FOR BLOOD PRESSURE SUPPORT)
PREFILLED_SYRINGE | INTRAVENOUS | Status: AC
  Filled 2017-05-10: qty 10

## 2017-05-10 MED ORDER — FENTANYL CITRATE (PF) 100 MCG/2ML IJ SOLN: 50.0000 ug | INTRAMUSCULAR | Status: DC | PRN

## 2017-05-10 MED ORDER — MIDAZOLAM HCL 2 MG/2ML IJ SOLN
INTRAMUSCULAR | Status: AC
  Filled 2017-05-10: qty 2

## 2017-05-10 SURGICAL SUPPLY — 41 items
ADH SKN CLS APL DERMABOND .7 (GAUZE/BANDAGES/DRESSINGS) ×1
APPLIER CLIP 9.375 MED OPEN (MISCELLANEOUS) ×3
APR CLP MED 9.3 20 MLT OPN (MISCELLANEOUS) ×1
BLADE SURG 15 STRL LF DISP TIS (BLADE) ×1 IMPLANT
BLADE SURG 15 STRL SS (BLADE) ×3
CANISTER SUCT 1200ML W/VALVE (MISCELLANEOUS) ×3 IMPLANT
CLIP APPLIE 9.375 MED OPEN (MISCELLANEOUS) IMPLANT
COVER BACK TABLE 60X90IN (DRAPES) ×3 IMPLANT
COVER MAYO STAND STRL (DRAPES) ×3 IMPLANT
DECANTER SPIKE VIAL GLASS SM (MISCELLANEOUS) ×3 IMPLANT
DERMABOND ADVANCED (GAUZE/BANDAGES/DRESSINGS) ×2
DERMABOND ADVANCED .7 DNX12 (GAUZE/BANDAGES/DRESSINGS) ×1 IMPLANT
DRAPE LAPAROTOMY 100X72 PEDS (DRAPES) ×3 IMPLANT
DRAPE UTILITY XL STRL (DRAPES) ×3 IMPLANT
ELECT COATED BLADE 2.86 ST (ELECTRODE) ×3 IMPLANT
ELECT REM PT RETURN 9FT ADLT (ELECTROSURGICAL) ×3
ELECTRODE REM PT RTRN 9FT ADLT (ELECTROSURGICAL) ×1 IMPLANT
GLOVE BIOGEL PI IND STRL 8 (GLOVE) ×1 IMPLANT
GLOVE BIOGEL PI INDICATOR 8 (GLOVE) ×2
GLOVE ECLIPSE 8.0 STRL XLNG CF (GLOVE) ×3 IMPLANT
GOWN STRL REUS W/ TWL LRG LVL3 (GOWN DISPOSABLE) ×2 IMPLANT
GOWN STRL REUS W/TWL LRG LVL3 (GOWN DISPOSABLE) ×6
KIT MARKER MARGIN INK (KITS) ×2 IMPLANT
NDL HYPO 25X1 1.5 SAFETY (NEEDLE) ×1 IMPLANT
NEEDLE HYPO 25X1 1.5 SAFETY (NEEDLE) ×3 IMPLANT
NS IRRIG 1000ML POUR BTL (IV SOLUTION) ×3 IMPLANT
PACK BASIN DAY SURGERY FS (CUSTOM PROCEDURE TRAY) ×3 IMPLANT
PENCIL BUTTON HOLSTER BLD 10FT (ELECTRODE) ×3 IMPLANT
SLEEVE SCD COMPRESS KNEE MED (MISCELLANEOUS) ×3 IMPLANT
SPONGE LAP 4X18 X RAY DECT (DISPOSABLE) ×3 IMPLANT
STAPLER VISISTAT 35W (STAPLE) IMPLANT
SUT MON AB 4-0 PC3 18 (SUTURE) ×3 IMPLANT
SUT SILK 2 0 SH (SUTURE) IMPLANT
SUT VICRYL 3-0 CR8 SH (SUTURE) ×3 IMPLANT
SYR CONTROL 10ML LL (SYRINGE) ×3 IMPLANT
TOWEL OR 17X24 6PK STRL BLUE (TOWEL DISPOSABLE) ×6 IMPLANT
TOWEL OR NON WOVEN STRL DISP B (DISPOSABLE) ×3 IMPLANT
TRAY DSU PREP LF (CUSTOM PROCEDURE TRAY) ×2 IMPLANT
TUBE CONNECTING 20'X1/4 (TUBING) ×1
TUBE CONNECTING 20X1/4 (TUBING) ×2 IMPLANT
YANKAUER SUCT BULB TIP NO VENT (SUCTIONS) ×3 IMPLANT

## 2017-05-10 NOTE — Anesthesia Preprocedure Evaluation (Signed)
Anesthesia Evaluation  Patient identified by MRN, date of birth, ID band Patient awake    Reviewed: Allergy & Precautions, NPO status , Patient's Chart, lab work & pertinent test results  Airway Mallampati: II  TM Distance: >3 FB Neck ROM: Full    Dental no notable dental hx.    Pulmonary neg pulmonary ROS,    Pulmonary exam normal breath sounds clear to auscultation       Cardiovascular negative cardio ROS Normal cardiovascular exam Rhythm:Regular Rate:Normal     Neuro/Psych negative neurological ROS  negative psych ROS   GI/Hepatic negative GI ROS, Neg liver ROS,   Endo/Other  negative endocrine ROS  Renal/GU negative Renal ROS  negative genitourinary   Musculoskeletal negative musculoskeletal ROS (+)   Abdominal   Peds negative pediatric ROS (+)  Hematology negative hematology ROS (+)   Anesthesia Other Findings   Reproductive/Obstetrics negative OB ROS                             Anesthesia Physical Anesthesia Plan  ASA: II  Anesthesia Plan: General   Post-op Pain Management:    Induction: Intravenous  PONV Risk Score and Plan: 2 and Ondansetron and Dexamethasone  Airway Management Planned: LMA  Additional Equipment:   Intra-op Plan:   Post-operative Plan:   Informed Consent: I have reviewed the patients History and Physical, chart, labs and discussed the procedure including the risks, benefits and alternatives for the proposed anesthesia with the patient or authorized representative who has indicated his/her understanding and acceptance.   Dental advisory given  Plan Discussed with: CRNA and Surgeon  Anesthesia Plan Comments:         Anesthesia Quick Evaluation  

## 2017-05-10 NOTE — Interval H&P Note (Signed)
History and Physical Interval Note:  05/10/2017 1:37 PM  Carla Velez  has presented today for surgery, with the diagnosis of RIGHT BREAST CANCER  The various methods of treatment have been discussed with the patient and family. After consideration of risks, benefits and other options for treatment, the patient has consented to  Procedure(s): RE-EXCISION OF BREAST LUMPECTOMY (Right) as a surgical intervention .  The patient's history has been reviewed, patient examined, no change in status, stable for surgery.  I have reviewed the patient's chart and labs.  Questions were answered to the patient's satisfaction.     [name]

## 2017-05-10 NOTE — Discharge Instructions (Signed)
[facility] °Office Phone Number [phone] ° °BREAST BIOPSY/ PARTIAL MASTECTOMY: POST OP INSTRUCTIONS ° °Always review your discharge instruction sheet given to you by the facility where your surgery was performed. ° °IF YOU HAVE DISABILITY OR FAMILY LEAVE FORMS, YOU MUST BRING THEM TO THE OFFICE FOR PROCESSING.  DO NOT GIVE THEM TO YOUR DOCTOR. ° °1. A prescription for pain medication may be given to you upon discharge.  Take your pain medication as prescribed, if needed.  If narcotic pain medicine is not needed, then you may take acetaminophen (Tylenol) or ibuprofen (Advil) as needed. °2. Take your usually prescribed medications unless otherwise directed °3. If you need a refill on your pain medication, please contact your pharmacy.  They will contact our office to request authorization.  Prescriptions will not be filled after 5pm or on week-ends. °4. You should eat very light the first 24 hours after surgery, such as soup, crackers, pudding, etc.  Resume your normal diet the day after surgery. °5. Most patients will experience some swelling and bruising in the breast.  Ice packs and a good support bra will help.  Swelling and bruising can take several days to resolve.  °6. It is common to experience some constipation if taking pain medication after surgery.  Increasing fluid intake and taking a stool softener will usually help or prevent this problem from occurring.  A mild laxative (Milk of Magnesia or Miralax) should be taken according to package directions if there are no bowel movements after 48 hours. °7. Unless discharge instructions indicate otherwise, you may remove your bandages 24-48 hours after surgery, and you may shower at that time.  You may have steri-strips (small skin tapes) in place directly over the incision.  These strips should be left on the skin for 7-10 days.  If your surgeon used skin glue on the incision, you may shower in 24 hours.  The glue will flake off over the  next 2-3 weeks.  Any sutures or staples will be removed at the office during your follow-up visit. °8. ACTIVITIES:  You may resume regular daily activities (gradually increasing) beginning the next day.  Wearing a good support bra or sports bra minimizes pain and swelling.  You may have sexual intercourse when it is comfortable. °a. You may drive when you no longer are taking prescription pain medication, you can comfortably wear a seatbelt, and you can safely maneuver your car and apply brakes. °b. RETURN TO WORK:  ______________________________________________________________________________________ °9. You should see your doctor in the office for a follow-up appointment approximately two weeks after your surgery.  Your doctor’s nurse will typically make your follow-up appointment when she calls you with your pathology report.  Expect your pathology report 2-3 business days after your surgery.  You may call to check if you do not hear from us after three days. °10. OTHER INSTRUCTIONS: _______________________________________________________________________________________________ _____________________________________________________________________________________________________________________________________ °_____________________________________________________________________________________________________________________________________ °_____________________________________________________________________________________________________________________________________ ° °WHEN TO CALL YOUR DOCTOR: °1. Fever over 101.0 °2. Nausea and/or vomiting. °3. Extreme swelling or bruising. °4. Continued bleeding from incision. °5. Increased pain, redness, or drainage from the incision. ° °The clinic staff is available to answer your questions during regular business hours.  Please don’t hesitate to call and ask to speak to one of the nurses for clinical concerns.  If you have a medical emergency, go to the nearest  emergency room or call 911.  A surgeon from [facility] is always on call at the hospital. ° °For further questions, please visit [email or web address]  ° ° ° ° °  Post Anesthesia Home Care Instructions ° °Activity: °Get plenty of rest for the remainder of the day. A responsible individual must stay with you for 24 hours following the procedure.  °For the next 24 hours, DO NOT: °-Drive a car °-Operate machinery °-Drink alcoholic beverages °-Take any medication unless instructed by your physician °-Make any legal decisions or sign important papers. ° °Meals: °Start with liquid foods such as gelatin or soup. Progress to regular foods as tolerated. Avoid greasy, spicy, heavy foods. If nausea and/or vomiting occur, drink only clear liquids until the nausea and/or vomiting subsides. Call your physician if vomiting continues. ° °Special Instructions/Symptoms: °Your throat may feel dry or sore from the anesthesia or the breathing tube placed in your throat during surgery. If this causes discomfort, gargle with warm salt water. The discomfort should disappear within 24 hours. ° °If you had a scopolamine patch placed behind your ear for the management of post- operative nausea and/or vomiting: ° °1. The medication in the patch is effective for 72 hours, after which it should be removed.  Wrap patch in a tissue and discard in the trash. Wash hands thoroughly with soap and water. °2. You may remove the patch earlier than 72 hours if you experience unpleasant side effects which may include dry mouth, dizziness or visual disturbances. °3. Avoid touching the patch. Wash your hands with soap and water after contact with the patch. °  ° °

## 2017-05-10 NOTE — H&P (Signed)
Carla Velez is an 41 y.o. female.   Chief Complaint: right breast cancer HPI: Pt presents for re excision right breast lumpectomy for breast cancer.  She had a positive superior margin on her path.  Nodes were clear.   Past Medical History:  Diagnosis Date  . Family history of adverse reaction to anesthesia    maternal GM---woke up during surgery  . History of kidney stones   . Meibomian gland dysfunction     Past Surgical History:  Procedure Laterality Date  . BREAST CYST ASPIRATION    . BREAST LUMPECTOMY WITH RADIOACTIVE SEED AND SENTINEL LYMPH NODE BIOPSY Right 04/29/2017   Procedure: BREAST LUMPECTOMY WITH RADIOACTIVE SEED AND SENTINEL LYMPH NODE BIOPSY;  Surgeon: Erroll Luna, MD;  Location: [facility];  Service: General;  Laterality: Right;  . DILATION AND CURETTAGE OF UTERUS    . TONSILLECTOMY      Family History  Problem Relation Age of Onset  . Breast cancer Mother 9  . Heart disease Father   . Kidney failure Father   . Macrosomia Maternal Grandmother   . Macular degeneration Maternal Grandmother   . Arthritis Maternal Grandmother   . Heart disease Maternal Grandfather   . Kidney disease Paternal Grandfather    Social History:  reports that she has never smoked. She has never used smokeless tobacco. She reports that she drinks alcohol. She reports that she does not use drugs.  Allergies:  Allergies  Allergen Reactions  . Chloraprep One Step [Chlorhexidine Gluconate] Rash  . Erythromycin Hives  . Silicone Hives    plugs    Medications Prior to Admission  Medication Sig Dispense Refill  . ARTIFICIAL TEAR OP Apply to eye as needed.    . Cod Liver Oil 5000-500 UNIT/5ML OIL Take 1 tablet by mouth daily.    . fexofenadine (ALLEGRA) 30 MG tablet Take by mouth as needed.    Marland Kitchen ibuprofen (ADVIL,MOTRIN) 800 MG tablet Take 1 tablet (800 mg total) by mouth every 8 (eight) hours as needed. 30 tablet 0  . loratadine (CLARITIN) 10 MG tablet Take 10 mg  by mouth daily as needed for allergies.    . Multiple Vitamin (MULTIVITAMIN) capsule Take 1 capsule by mouth daily.    Marland Kitchen oxyCODONE (OXY IR/ROXICODONE) 5 MG immediate release tablet Take 1-2 tablets (5-10 mg total) by mouth every 6 (six) hours as needed for severe pain. 15 tablet 0    No results found for this or any previous visit (from the past 48 hour(s)). No results found.  Review of Systems  Constitutional: Negative for chills and fever.  Cardiovascular: Negative for chest pain.    Blood pressure (!) 96/57, pulse 65, temperature 98.4 F (36.9 C), temperature source Oral, resp. rate 18, height 5' 6.5" (1.689 m), weight 66.2 kg (146 lb), last menstrual period 04/21/2017, SpO2 100 %. Physical Exam  Constitutional: She appears well-developed and well-nourished.  HENT:  Head: Normocephalic and atraumatic.  Eyes: Pupils are equal, round, and reactive to light.  Respiratory: Effort normal.  Right breast incisions intact   Psychiatric: She has a normal mood and affect. Her behavior is normal.     Assessment/Plan Right breast cancer   Re excision right breast lumpectomy   The procedure has been discussed with the patient. Alternatives to surgery have been discussed with the patient.  Risks of surgery include bleeding,  Infection,  Seroma formation, death,  and the need for further surgery.   The patient understands and wishes to proceed.  Aranza Geddes A., MD 05/10/2017, 1:35 PM

## 2017-05-10 NOTE — Transfer of Care (Signed)
Immediate Anesthesia Transfer of Care Note  Patient: Charleston Vierling  Procedure(s) Performed: Procedure(s): RE-EXCISION OF BREAST LUMPECTOMY (Right)  Patient Location: PACU  Anesthesia Type:General  Level of Consciousness: awake and patient cooperative  Airway & Oxygen Therapy: Patient Spontanous Breathing and Patient connected to face mask oxygen  Post-op Assessment: Report given to RN and Post -op Vital signs reviewed and stable  Post vital signs: Reviewed and stable  Last Vitals:  Vitals:   05/10/17 1311  BP: (!) 96/57  Pulse: 65  Resp: 18  Temp: 36.9 C    Last Pain:  Vitals:   05/10/17 1311  TempSrc: Oral  PainSc: 4       Patients Stated Pain Goal: 1 (03/75/43 6067)  Complications: No apparent anesthesia complications

## 2017-05-10 NOTE — Op Note (Signed)
Breast Re-excison Lumpectomy Procedure Note Right  Indications:  This patient returns following an initial lumpectomy.  Analysis of the pathology specimen revealed microscopic involvement of the superior margins.  The patient now returns for re-excision.  Pre-operative Diagnosis: right breast cancer  Post-operative Diagnosis: right breast cancer  Surgeon: Erroll Luna A.   Assistants: none   Anesthesia: General LMA anesthesia and Local anesthesia 0.25.% bupivacaine, with epinephrine  ASA Class: 1  Procedure Details  The patient was seen in the Holding Room. The risks, benefits, complications, treatment options, and expected outcomes were discussed with the patient. The possibilities of reaction to medication, pulmonary aspiration, bleeding, infection, the need for additional procedures, failure to diagnose a condition, and creating a complication requiring transfusion or operation were discussed with the patient. The patient concurred with the proposed plan, giving informed consent.  The site of surgery properly noted/marked. The patient was taken to Operating Room # 7, identified as Ketra Duchesne and the procedure verified as Breast Re-excision Lumpectomy. A Time Out was held and the above information confirmed.  the patient was placed supine.  The breast was prepped and draped in standard fashion. Marcaine 0.25% with epinephrine was used to anesthetize the skin around the previous lumpectomy incision.  The incision was opened.  A  seroma was evacuated.  Additional local anesthesia was delivered superiorly within the lumpectomy cavity.  A full thickness re-excision was performed.  An orientation ink  was placed superiorly .  The new margin was inked and the specimen was submitted to pathology.  Hemostasis was achieved with cautery.  Closure was performed in 2 layers with a 3-0 Vicryl and 4 O monocryl  subcuticular closure.    Dermabond  applied.  At the end of the operation, all sponge,  instrument and needle counts were correct.   Findings: grossly clear surgical margins  Estimated Blood Loss:  Minimal                  Total IV Fluids: per anesthesia          Specimens: see above                  Complications:  None; patient tolerated the procedure well.         Disposition: PACU - hemodynamically stable.         Condition: stable  Attending Attestation: I performed the procedure.

## 2017-05-10 NOTE — Telephone Encounter (Deleted)
-----   Message from Mal Misty sent at 04/22/2017  8:57 AM EDT ----- Route to Dr. Lindi Adie, Dr. Brantley Stage, Dr. Lisbeth Renshaw STAT panel previously reported 04/22/2017

## 2017-05-10 NOTE — Anesthesia Postprocedure Evaluation (Signed)
Anesthesia Post Note  Patient: Carla Velez  Procedure(s) Performed: Procedure(s) (LRB): RE-EXCISION OF BREAST LUMPECTOMY (Right)     Patient location during evaluation: PACU Anesthesia Type: General Level of consciousness: awake and alert Pain management: pain level controlled Vital Signs Assessment: post-procedure vital signs reviewed and stable Respiratory status: spontaneous breathing, nonlabored ventilation, respiratory function stable and patient connected to nasal cannula oxygen Cardiovascular status: blood pressure returned to baseline and stable Postop Assessment: no signs of nausea or vomiting Anesthetic complications: no    Last Vitals:  Vitals:   05/10/17 1441 05/10/17 1445  BP: 138/89 137/87  Pulse: (!) 121 (!) 113  Resp: (!) 22 19  Temp: 36.6 C     Last Pain:  Vitals:   05/10/17 1441  TempSrc:   PainSc: 0-No pain                 [name] S

## 2017-05-10 NOTE — Anesthesia Procedure Notes (Signed)
Procedure Name: LMA Insertion Date/Time: 05/10/2017 2:07 PM Performed by: Toula Moos L Pre-anesthesia Checklist: Patient identified, Emergency Drugs available, Suction available, Patient being monitored and Timeout performed Patient Re-evaluated:Patient Re-evaluated prior to inductionOxygen Delivery Method: Circle system utilized Preoxygenation: Pre-oxygenation with 100% oxygen Intubation Type: IV induction Ventilation: Mask ventilation without difficulty LMA: LMA inserted LMA Size: 4.0 Number of attempts: 1 Airway Equipment and Method: Bite block Placement Confirmation: positive ETCO2 Tube secured with: Tape Dental Injury: Teeth and Oropharynx as per pre-operative assessment

## 2017-05-11 ENCOUNTER — Encounter: Payer: Self-pay | Admitting: Genetics

## 2017-05-11 ENCOUNTER — Ambulatory Visit: Payer: Self-pay | Admitting: Genetics

## 2017-05-11 DIAGNOSIS — Z1379 Encounter for other screening for genetic and chromosomal anomalies: Secondary | ICD-10-CM

## 2017-05-11 HISTORY — DX: Encounter for other screening for genetic and chromosomal anomalies: Z13.79

## 2017-05-12 NOTE — Progress Notes (Signed)
Location of Breast Cancer:Right Breast Cancer, Upper Inner Quadrant  Histology per Pathology Report:  04/07/17:  Right breast,bx,1:30 o'clock position=Invasive Ductal carcinoma  Receptor Status: ER(95%+), PR (90%+), Her2-neu (0%67), Ki-(10%)  Did patient present with symptoms (if so, please note symptoms) or was this found on screening mammography?: GYN MD found then went to  Have a mammogram  Past/Anticipated interventions by surgeon, if any: Dr. Erroll Luna, MD: 05/10/2017:Procedure(s) Performed: Procedure(s) (LRB): RE-EXCISION OF BREAST LUMPECTOMY (Right) BREAST TISSUE WITH RESECTION SITE CHANGES NEGATIVE FOR INVASIVE CARCINOMA Diagnosis 04/29/17: 1. 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 - DUCTAL CARCINOMA IN SITU, LOW GRADE. - INVASIVE CARCINOMA IS BROADLY PRESENT AT THE SUPERIOR MARGIN. - BIOPSY SITE. - FIBROCYSTIC CHANGE, FIBROADENOMATOID CHANGE. - SEE ONCOLOGY TABLE. 2. Lymph node, sentinel, biopsy, Right Axillary - ONE OF ONE LYMPH NODE NEGATIVE FOR CARCINOMA (0/1). 3. Lymph node, sentinel, biopsy, Right - ONE OF ONE LYMPH NODE NEGATIVE FOR CARCINOMA (0/1).  Past/Anticipated interventions by medical oncology, if any: Chemotherapy : Dr. Lindi Adie, MD seen 04/14/17,, genetic testing  04/15/17  Negative ,appt 05/17/17 oncotype score=8/6%;  told to patient by Bary Castilla, RN navigator, no need for chemotherapy  Lymphedema issues, if any: No  Pain issues, if any:  Soreness fromn re-excision right breast  SAFETY ISSUES: No    Prior radiation?  NO  Pacemaker/ICD? NO  Possible current pregnancy : No last menses 03/2017    Current Complaints / other details: Seen in multidisciplinary clinic with Ashling Bruning, PA-C, , Married , anxiety, Menarche age [age],2 daughters , is an Warden/ranger, never used any tobacco products, no drug use,drinks alcohol, Mother breast cancer age 32,,Father heart disease Allergies: Eryhtomycin and  silicone, Hibiclens all rash BP 103/70   Pulse 75   Temp 99.6 F (37.6 C) (Oral)   Resp 20   Ht 5' 7.5" (1.715 m)   Wt 147 lb 9.6 oz (67 kg)   LMP 04/21/2017   BMI 22.78 kg/m   Wt Readings from Last 3 Encounters:  05/13/17 147 lb 9.6 oz (67 kg)  05/10/17 146 lb (66.2 kg)  04/29/17 146 lb (66.2 kg)      Rebecca Eaton, RN 05/12/2017,9:24 AM

## 2017-05-13 ENCOUNTER — Encounter (HOSPITAL_COMMUNITY): Payer: Self-pay

## 2017-05-13 ENCOUNTER — Ambulatory Visit
Admission: RE | Admit: 2017-05-13 | Discharge: 2017-05-13 | Disposition: A | Discharge status: Home / Self Care | Payer: BLUE CROSS/BLUE SHIELD | Source: Ambulatory Visit | Attending: Radiation Oncology | Admitting: Radiation Oncology

## 2017-05-13 ENCOUNTER — Telehealth: Payer: Self-pay | Admitting: *Deleted

## 2017-05-13 ENCOUNTER — Encounter: Payer: Self-pay | Admitting: Radiation Oncology

## 2017-05-13 DIAGNOSIS — C50211 Malignant neoplasm of upper-inner quadrant of right female breast: Secondary | ICD-10-CM

## 2017-05-13 DIAGNOSIS — Z170 Estrogen receptor positive status [ER+]: Principal | ICD-10-CM

## 2017-05-13 DIAGNOSIS — Z87442 Personal history of urinary calculi: Secondary | ICD-10-CM | POA: Diagnosis not present

## 2017-05-13 DIAGNOSIS — F419 Anxiety disorder, unspecified: Secondary | ICD-10-CM | POA: Diagnosis not present

## 2017-05-13 DIAGNOSIS — G709 Myoneural disorder, unspecified: Secondary | ICD-10-CM | POA: Diagnosis not present

## 2017-05-13 DIAGNOSIS — Z9889 Other specified postprocedural states: Secondary | ICD-10-CM | POA: Diagnosis not present

## 2017-05-13 HISTORY — DX: Anxiety disorder, unspecified: F41.9

## 2017-05-13 HISTORY — DX: Malignant neoplasm of unspecified site of unspecified female breast: C50.919

## 2017-05-13 HISTORY — DX: Allergy, unspecified, initial encounter: T78.40XA

## 2017-05-13 HISTORY — DX: Myoneural disorder, unspecified: G70.9

## 2017-05-13 NOTE — Progress Notes (Signed)
Please see the Nurse Progress Note in the MD Initial Consult Encounter for this patient. 

## 2017-05-13 NOTE — Telephone Encounter (Signed)
Received oncotype score of 8/6%. Physician team notified. Called pt with results. Informed she does not need chemo and her next step is xrt with Dr. Lisbeth Renshaw. Confirmed appt on 05/13/17. Denies further needs or questions.

## 2017-05-13 NOTE — Progress Notes (Signed)
Radiation Oncology         ([phone]) [phone] ________________________________  Name: Carla Velez MRN: [id number]  Date: 05/13/2017  DOB: 08/26/1976  VQ:[id number], No Pcp Per  Erroll Luna, MD     REFERRING PHYSICIAN: Erroll Luna, MD   DIAGNOSIS: The encounter diagnosis was Malignant neoplasm of upper-inner quadrant of right breast in female, estrogen receptor positive ([place]).   HISTORY OF PRESENT ILLNESS:  Carla Velez is a 41 y.o. female originally seen in multidisciplinary breast clinic for a right breast cancer. She presented to her gynecologist for routine examon 03/31/17 and was found to have a 1 x 1 cm palpable mass in the upper inner quadrant of the right breast near the areola.  Diagnostic mammography on 04/05/17 revealed no definite focal abnormality over the inner upper right periareolar region to correspond to the patient's palpable abnormality. Ultrasound was performed that same day and revealed a hypoechoic mass with irregular shape and margins at the 1:30 position, 2 cm from the nipple, corresponding to the patient's palpable abnormality and measuring 1.2 x 1.3 x 2.0 cm. U/S of the right axilla was negative.  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 Hormone receptor status is ER+ (95%), PR+ (90%), HER-2 (negative) and Ki67 (10%).   She proceeded with lumpectomy and sentinel node evaluation on 04/29/17 which revealed a grade 2 IDC spanning 1.3 cm, and low grade DCIS with invasive carcinoma broadly present at the superior margin. 2 nodes were assessed and did not contain malignancy. She was taken back to the OR on 6/18 for reexcision of her superior margin and final pathology revealed negative findings. Her oncotype was 8, and she will not receive chemotherapy. She comes today to discuss the role of adjuvant radiotherapy.  PREVIOUS RADIATION THERAPY: No   PAST MEDICAL HISTORY:  Past Medical History:  Diagnosis Date  . Allergy   . Anxiety   . Breast  cancer ([place]) 04/07/2017   right breast  . Family history of adverse reaction to anesthesia    maternal GM---woke up during surgery  . Genetic testing 05/11/2017   Ms. Fiske underwent genetic counseling and testing for hereditary cancer syndromes on 04/15/2017. Her results were negative for mutations in all 46 genes analyzed by [facility]'s 46-gene Common Hereditary Cancers Panel. Genes analyzed include: APC, ATM, AXIN2, BARD1, BMPR1A, BRCA1, BRCA2, BRIP1, CDH1, CDKN2A, CHEK2, CTNNA1, DICER1, EPCAM, GREM1, HOXB13, KIT, MEN1, MLH1, MSH2, MSH3, MSH6, MUTYH, NB  . History of kidney stones   . Meibomian gland dysfunction   . Neuromuscular disorder ([place])    under arm right after breast surgry numbness       PAST SURGICAL HISTORY: Past Surgical History:  Procedure Laterality Date  . BREAST CYST ASPIRATION    . BREAST LUMPECTOMY WITH RADIOACTIVE SEED AND SENTINEL LYMPH NODE BIOPSY Right 04/29/2017   Procedure: BREAST LUMPECTOMY WITH RADIOACTIVE SEED AND SENTINEL LYMPH NODE BIOPSY;  Surgeon: Erroll Luna, MD;  Location: [facility];  Service: General;  Laterality: Right;  . DILATION AND CURETTAGE OF UTERUS    . RE-EXCISION OF BREAST LUMPECTOMY Right 05/10/2017   Procedure: RE-EXCISION OF BREAST LUMPECTOMY;  Surgeon: Erroll Luna, MD;  Location: [facility];  Service: General;  Laterality: Right;  . TONSILLECTOMY       FAMILY HISTORY:  Family History  Problem Relation Age of Onset  . Breast cancer Mother 65  . Heart disease Father   . Kidney failure Father   . Macrosomia Maternal Grandmother   . Macular  degeneration Maternal Grandmother   . Arthritis Maternal Grandmother   . Heart disease Maternal Grandfather   . Kidney disease Paternal Grandfather      SOCIAL HISTORY:  reports that she has never smoked. She has never used smokeless tobacco. She reports that she drinks alcohol. She reports that she does not use drugs. She is married and lives in  [place] with her husband and two daughters.  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   ALLERGIES: Chloraprep one step [chlorhexidine gluconate]; Erythromycin; and Silicone   MEDICATIONS:  Current Outpatient Prescriptions  Medication Sig Dispense Refill  . ARTIFICIAL TEAR OP Apply to eye as needed.    . Cod Liver Oil 5000-500 UNIT/5ML OIL Take 1 tablet by mouth daily.    Marland Kitchen ibuprofen (ADVIL,MOTRIN) 800 MG tablet Take 1 tablet (800 mg total) by mouth every 8 (eight) hours as needed. 30 tablet 0  . Multiple Vitamin (MULTIVITAMIN) capsule Take 1 capsule by mouth daily.    . fexofenadine (ALLEGRA) 30 MG tablet Take by mouth as needed.    . loratadine (CLARITIN) 10 MG tablet Take 10 mg by mouth daily as needed for allergies.    Marland Kitchen oxyCODONE (OXY IR/ROXICODONE) 5 MG immediate release tablet Take 1-2 tablets (5-10 mg total) by mouth every 6 (six) hours as needed for severe pain. (Patient not taking: Reported on 05/13/2017) 15 tablet 0  . oxyCODONE (OXY IR/ROXICODONE) 5 MG immediate release tablet Take 1-2 tablets (5-10 mg total) by mouth every 6 (six) hours as needed for severe pain. (Patient not taking: Reported on 05/13/2017) 10 tablet 0   No current facility-administered medications for this encounter.      REVIEW OF SYSTEMS: On review of systems, the patient reports that she is doing well overall. She is sore from her surgery on Monday. No other complaints are noted.   PHYSICAL EXAM:  Wt Readings from Last 3 Encounters:  05/13/17 147 lb 9.6 oz (67 kg)  05/10/17 146 lb (66.2 kg)  04/29/17 146 lb (66.2 kg)   Temp Readings from Last 3 Encounters:  05/13/17 99.6 F (37.6 C) (Oral)  05/10/17 97.9 F (36.6 C)  04/29/17 97.6 F (36.4 C) (Oral)   BP Readings from Last 3 Encounters:  05/13/17 103/70  05/10/17 (!) 107/53    04/29/17 128/73   Pulse Readings from Last 3 Encounters:  05/13/17 75  05/10/17 73  04/29/17 83    In general this is a well appearing caucasian female in no acute distress. She is alert and oriented x4 and appropriate throughout the examination. HEENT reveals that the patient is normocephalic, atraumatic. EOMs are intact. PERRLA. Skin is intact without any evidence of gross lesions. Cardiopulmonary assessment is negative for acute distress and she exhibits normal effort. Breast exam is deferred.    ECOG = 0  0 - Asymptomatic (Fully active, able to carry on all predisease activities without restriction)  1 - Symptomatic but completely ambulatory (Restricted in physically strenuous activity but ambulatory and able to carry out work of a light or sedentary nature. For example, light housework, office work)  2 - Symptomatic, <50% in bed during the day (Ambulatory and capable of all self care but unable to carry out any work activities. Up and about more than 50% of waking hours)  3 - Symptomatic, >50% in bed, but not bedbound (  Capable of only limited self-care, confined to bed or chair 50% or more of waking hours)  4 - Bedbound (Completely disabled. Cannot carry on any self-care. Totally confined to bed or chair)  5 - Death   Eustace Pen MM, Creech RH, Tormey DC, et al. [phone]). "Toxicity and response criteria of the [facility] Group". [place] Oncol. 5 (6): 649-55    LABORATORY DATA:  Lab Results  Component Value Date   WBC 7.3 04/14/2017   HGB 13.4 04/14/2017   HCT 41.0 04/14/2017   MCV 89.3 04/14/2017   PLT 185 04/14/2017   Lab Results  Component Value Date   NA 144 04/14/2017   K 4.1 04/14/2017   CO2 25 04/14/2017   Lab Results  Component Value Date   ALT 14 04/14/2017   AST 17 04/14/2017   ALKPHOS 65 04/14/2017   BILITOT 0.68 04/14/2017      RADIOGRAPHY: Nm Sentinel Node Inj-no Rpt (breast)  Result Date: 05/07/2017 There is no Radiologist  interpretation  for this exam.  Mm Breast Surgical Specimen  Result Date: 04/29/2017 CLINICAL DATA:  Right lumpectomy for invasive ductal carcinoma. EXAM: SPECIMEN RADIOGRAPH OF THE RIGHT BREAST COMPARISON:  Previous exam(s). FINDINGS: Status post excision of the right breast. The radioactive seed and biopsy marker clip are present, completely intact, and were marked for pathology. IMPRESSION: Specimen radiograph of the right breast. Electronically Signed   By: Claudie Revering M.D.   On: 04/29/2017 13:44   Mm Rt Radioactive Seed Loc Mammo Guide  Result Date: 04/27/2017 CLINICAL DATA:  Localize known breast cancer for surgery EXAM: MAMMOGRAPHIC GUIDED RADIOACTIVE SEED LOCALIZATION OF THE RIGHT BREAST COMPARISON:  Previous exam(s). FINDINGS: Patient presents for radioactive seed localization prior to surgery. I met with the patient and we discussed the procedure of seed localization including benefits and alternatives. We discussed the high likelihood of a successful procedure. We discussed the risks of the procedure including infection, bleeding, tissue injury and further surgery. We discussed the low dose of radioactivity involved in the procedure. Informed, written consent was given. The usual time-out protocol was performed immediately prior to the procedure. Using mammographic guidance, sterile technique, 1% lidocaine and an I-125 radioactive seed, the patient's known malignant was localized using a media approach. The follow-up mammogram images confirm the seed within 6 mm of the biopsy clip. Of note, the malignancy measured 2 x 1.3 x 1.2 cm on ultrasound and the clip appeared to be within the middle of the mass after biopsy. As a result, the radioactive seed is likely along the anterior margin of the mass. Follow-up survey of the patient confirms presence of the radioactive seed. Order number of I-125 seed:  [id number]. Total activity:  6.144 millicuries  Reference Date: Apr 14, 2017 The patient tolerated the  procedure well and was released from the [facility]. She was given instructions regarding seed removal. IMPRESSION: Radioactive seed localization right breast. No apparent complications. Electronically Signed   By: Dorise Bullion III M.D   On: 04/27/2017 14:39       IMPRESSION/PLAN: 1. Stage IA, [id number], grade II, ER/PR+ negative invasive ductal carcinoma of the right breast. We reviewed the final pathology findings and based on her oncotype score, there is not a role for chemotherapy. The patient's course would then be followed by external radiotherapy to the breast followed by antiestrogen therapy. The patient has significant concerns about side effects of therapy as a result of her mother's experiences. We discussed the risks, benefits, short,  and long term effects of radiotherapy. Dr. Lisbeth Renshaw discusses the delivery and logistics of radiotherapy and reviews his recommendation for a course of 6-1/2 weeks of radiotherapy. However much of our discussion was regarding her hopes of avoiding treatment due to her mother's prior experience. Dr. Lisbeth Renshaw discusses that per the guidelines, if she desires the same overall local risk reduction of disease, discussion of mastectomy would be considered. However if she is not interested in either approach, radiotherapy versus mastectomy, there would be concerns for an overall increase in the possibility of recurrence. She will consider our discussion and let us know how she'd like to proceed. She also lives in [place] and is aware that there is a radiation oncology office closer to her home, and as a result, she may consider therapy there if she does elect for radiation.  In a visit lasting 90 minutes, greater than 50% of the time was spent face to face discussing her concerns about side effects of radiotherapy, and coordinating the patient's care.  The above documentation reflects my direct findings during this shared patient visit. Please see the separate note by  Dr. Lisbeth Renshaw on this date for the remainder of the patient's plan of care.     Carola Rhine, PAC

## 2017-05-13 NOTE — Telephone Encounter (Signed)
called and spoke with patient, she stated she got her oncotype score of 8 this morning and her husband is off today and would rather come in and speak with MD today not reschedule, spoke with Dr. Lisbeth Renshaw and since we have her oncotype score to  keep todays appt, patient stated she would be her with her husband 9:25 AM

## 2017-05-14 ENCOUNTER — Other Ambulatory Visit: Payer: Self-pay | Admitting: Hematology and Oncology

## 2017-05-17 ENCOUNTER — Ambulatory Visit (HOSPITAL_BASED_OUTPATIENT_CLINIC_OR_DEPARTMENT_OTHER): Payer: BLUE CROSS/BLUE SHIELD | Admitting: Hematology and Oncology

## 2017-05-17 ENCOUNTER — Encounter: Payer: Self-pay | Admitting: Hematology and Oncology

## 2017-05-17 DIAGNOSIS — C50211 Malignant neoplasm of upper-inner quadrant of right female breast: Secondary | ICD-10-CM | POA: Diagnosis not present

## 2017-05-17 DIAGNOSIS — Z170 Estrogen receptor positive status [ER+]: Secondary | ICD-10-CM | POA: Diagnosis not present

## 2017-05-17 NOTE — Progress Notes (Signed)
Patient Care Team: Patient, No Pcp Per as PCP - General (General Practice) [name], MD as Consulting Physician (Obstetrics and Gynecology) Erroll Luna, MD as Consulting Physician (General Surgery) Nicholas Lose, MD as Consulting Physician (Hematology and Oncology) Kyung Rudd, MD as Consulting Physician (Radiation Oncology)  DIAGNOSIS:  Encounter Diagnosis  Name Primary?  . Malignant neoplasm of upper-inner quadrant of right breast in female, estrogen receptor positive ([place])     SUMMARY OF ONCOLOGIC HISTORY:   Malignant neoplasm of upper-inner quadrant of right breast in female, estrogen receptor positive ([place])   04/07/2017 Initial Diagnosis    Palpable right breast lump by referring physician at 1:30 position 2 cm mass, axilla negative, biopsy: Grade 2 IDC ER 95%, PR 90%, Ki-67 10%, HER-2 negative ratio 1.4, T1 CN 0 stage IA clinical stage      04/29/2017 Surgery    Right lumpectomy: IDC grade 2, 1.3 cm, low-grade DCIS, superior margin and broadly positive, 0/2 lymph nodes negative, T1 CN 0 stage IA, ER 95%, PR 90%, HER-2 negative ratio 1.4, Ki-67 10%; reexcision superior margin: Benign breast tissue.      05/11/2017 Oncotype testing    Recurrence score 8; 10 year risk of distant recurrence with antiestrogen therapy alone at 6%       CHIEF COMPLIANT: Follow-up after recent right lumpectomy reexcision surgeries and to discuss Oncotype DX score   INTERVAL HISTORY: Carla Velez is a 41 year old with above-mentioned history of right breast cancer underwent lumpectomy. She had to undergo reexcision because of positive superior margin. We perform Oncotype DX testing and it came back as low risk. She is so she does not need chemotherapy. She is here to discuss those findings.  REVIEW OF SYSTEMS:   Constitutional: Denies fevers, chills or abnormal weight loss Eyes: Denies blurriness of vision Ears, nose, mouth, throat, and face: Denies mucositis or sore  throat Respiratory: Denies cough, dyspnea or wheezes Cardiovascular: Denies palpitation, chest discomfort Gastrointestinal:  Denies nausea, heartburn or change in bowel habits Skin: Denies abnormal skin rashes Lymphatics: Denies new lymphadenopathy or easy bruising Neurological:Denies numbness, tingling or new weaknesses Behavioral/Psych: Mood is stable, no new changes  Extremities: No lower extremity edema Breast: Right lumpectomy All other systems were reviewed with the patient and are negative.  I have reviewed the past medical history, past surgical history, social history and family history with the patient and they are unchanged from previous note.  ALLERGIES:  is allergic to chloraprep one step [chlorhexidine gluconate]; erythromycin; and silicone.  MEDICATIONS:  Current Outpatient Prescriptions  Medication Sig Dispense Refill  . ARTIFICIAL TEAR OP Apply to eye as needed.    . Cod Liver Oil 5000-500 UNIT/5ML OIL Take 1 tablet by mouth daily.    . fexofenadine (ALLEGRA) 30 MG tablet Take by mouth as needed.    Marland Kitchen ibuprofen (ADVIL,MOTRIN) 800 MG tablet Take 1 tablet (800 mg total) by mouth every 8 (eight) hours as needed. 30 tablet 0  . loratadine (CLARITIN) 10 MG tablet Take 10 mg by mouth daily as needed for allergies.    . Multiple Vitamin (MULTIVITAMIN) capsule Take 1 capsule by mouth daily.    Marland Kitchen oxyCODONE (OXY IR/ROXICODONE) 5 MG immediate release tablet Take 1-2 tablets (5-10 mg total) by mouth every 6 (six) hours as needed for severe pain. (Patient not taking: Reported on 05/13/2017) 15 tablet 0  . oxyCODONE (OXY IR/ROXICODONE) 5 MG immediate release tablet Take 1-2 tablets (5-10 mg total) by mouth every 6 (six) hours as needed for severe  pain. (Patient not taking: Reported on 05/13/2017) 10 tablet 0  . tamoxifen (NOLVADEX) 20 MG tablet TAKE 1 TABLET BY MOUTH EVERY DAY 30 tablet 0   No current facility-administered medications for this visit.     PHYSICAL EXAMINATION: ECOG  PERFORMANCE STATUS: 0 - Asymptomatic  Vitals:   05/17/17 1035  BP: 108/65  Pulse: 72  Resp: 18  Temp: 98.5 F (36.9 C)   Filed Weights   05/17/17 1035  Weight: 146 lb 6.4 oz (66.4 kg)    GENERAL:alert, no distress and comfortable SKIN: skin color, texture, turgor are normal, no rashes or significant lesions EYES: normal, Conjunctiva are pink and non-injected, sclera clear OROPHARYNX:no exudate, no erythema and lips, buccal mucosa, and tongue normal  NECK: supple, thyroid normal size, non-tender, without nodularity LYMPH:  no palpable lymphadenopathy in the cervical, axillary or inguinal LUNGS: clear to auscultation and percussion with normal breathing effort HEART: regular rate & rhythm and no murmurs and no lower extremity edema ABDOMEN:abdomen soft, non-tender and normal bowel sounds MUSCULOSKELETAL:no cyanosis of digits and no clubbing  NEURO: alert & oriented x 3 with fluent speech, no focal motor/sensory deficits EXTREMITIES: No lower extremity edema   LABORATORY DATA:  I have reviewed the data as listed   Chemistry      Component Value Date/Time   NA 144 04/14/2017 0857   K 4.1 04/14/2017 0857   CO2 25 04/14/2017 0857   BUN 12.9 04/14/2017 0857   CREATININE 0.8 04/14/2017 0857      Component Value Date/Time   CALCIUM 9.3 04/14/2017 0857   ALKPHOS 65 04/14/2017 0857   AST 17 04/14/2017 0857   ALT 14 04/14/2017 0857   BILITOT 0.68 04/14/2017 0857       Lab Results  Component Value Date   WBC 7.3 04/14/2017   HGB 13.4 04/14/2017   HCT 41.0 04/14/2017   MCV 89.3 04/14/2017   PLT 185 04/14/2017   NEUTROABS 4.5 04/14/2017    ASSESSMENT & PLAN:  Malignant neoplasm of upper-inner quadrant of right breast in female, estrogen receptor positive ([place]) 04/30/2017: Right lumpectomy: IDC grade 2, 1.3 cm, low-grade DCIS, superior margin and broadly positive, 0/2 lymph nodes negative, T1 CN 0 stage IA, ER 95%, PR 90%, HER-2 negative ratio 1.4, Ki-67 10%; reexcision  superior margin: Benign breast tissue.  Oncotype DX score 8: 10 year risk of distant recurrence 6% with antiestrogen therapy alone, low risk, no need of chemotherapy  Treatment Recommendation: 1. Adjuvant radiation therapy (patient is reluctant to receive radiation.) She in fact wants to meet with plastic surgery to talk about the role of mastectomy and reconstruction. 2. adjuvant antiestrogen therapy with tamoxifen 20 mg daily 5 years. However patient is also not interested in taking tamoxifen because she does not want to add more medications. She understands that the risk of distant recurrence without tamoxifen therapy could be about 12% in 10 years.  Surveillance: Patient would likely need to breast MRIs every other year along with annual mammograms especially if she does not undergo radiation therapy. This is because her risk of local recurrence is more than 20% if she does not undergo adjuvant radiation or adjuvant antiestrogen treatments.  Return to clinic in 6 months for surveillance checks.   I spent 25 minutes talking to the patient of which more than half was spent in counseling and coordination of care.  No orders of the defined types were placed in this encounter.  The patient has a good understanding of the overall plan.  she agrees with it. she will call with any problems that may develop before the next visit here.   Rulon Eisenmenger, MD 05/17/17

## 2017-05-17 NOTE — Assessment & Plan Note (Signed)
04/30/2017: Right lumpectomy: IDC grade 2, 1.3 cm, low-grade DCIS, superior margin and broadly positive, 0/2 lymph nodes negative, T1 CN 0 stage IA, ER 95%, PR 90%, HER-2 negative ratio 1.4, Ki-67 10%; reexcision superior margin: Benign breast tissue.  Oncotype DX score 8: 10 year risk of distant recurrence 6% with antiestrogen therapy alone, low risk, no need of chemotherapy  Treatment plan: 1. Adjuvant radiation therapy followed by 2. adjuvant antiestrogen therapy  Return to clinic in 6 months. She will resume tamoxifen once radiation is complete.

## 2017-05-17 NOTE — Progress Notes (Signed)
HPI: Ms. Jans was previously seen in the [facility] clinic on 04/15/2017 due to a personal and family history of cancer and concerns regarding a hereditary predisposition to cancer. Please refer to our prior cancer genetics clinic note for more information regarding Ms. Taff medical, social and family histories, and our assessment and recommendations, at the time. Ms. Yerby recent genetic test results were disclosed to her on 05/17/2017 as were recommendations warranted by these results. These results and recommendations are discussed in more detail below.  CANCER HISTORY:    Malignant neoplasm of upper-inner quadrant of right breast in female, estrogen receptor positive ([place])   04/07/2017 Initial Diagnosis    Palpable right breast lump by referring physician at 1:30 position 2 cm mass, axilla negative, biopsy: Grade 2 IDC ER 95%, PR 90%, Ki-67 10%, HER-2 negative ratio 1.4, T1 CN 0 stage IA clinical stage      04/29/2017 Surgery    Right lumpectomy: IDC grade 2, 1.3 cm, low-grade DCIS, superior margin and broadly positive, 0/2 lymph nodes negative, T1 CN 0 stage IA, ER 95%, PR 90%, HER-2 negative ratio 1.4, Ki-67 10%; reexcision superior margin: Benign breast tissue.      05/11/2017 Oncotype testing    Recurrence score 8; 10 year risk of distant recurrence with antiestrogen therapy alone at 6%        FAMILY HISTORY:  We obtained a detailed, 4-generation family history.  Significant diagnoses are listed below: Family History  Problem Relation Age of Onset  . Breast cancer Mother 56  . Heart disease Father   . Kidney failure Father   . Macrosomia Maternal Grandmother   . Macular degeneration Maternal Grandmother   . Arthritis Maternal Grandmother   . Heart disease Maternal Grandfather   . Kidney disease Paternal Grandfather    Ms. Fannin has two daughters, ages 38 and 23. She has a twin sister who has never had cancers.  Ms. Fluegge mother is  currently 22 and was diagnosed with breast cancer at age 78. Ms. Shakir has one maternal uncle who is 59 without cancers.  Ms. Procter maternal grandmother died in her 63s and her maternal grandfather died in his 19. Neither had cancers.  Ms. Waage father died at 18 from kidney and heart failure. Ms. Headen has 2 paternal uncles. One uncle died in an accident at age 71. The other uncle in in his late-70s without cancers. Ms. Fifield does not have information regarding her paternal grandparents.  Ms. Gens is unaware of previous family history of genetic testing for hereditary cancer risks. Patient's maternal ancestors are of Polish/Italian/german descent, and paternal ancestors are of [place] descent. There is no reported Ashkenazi Jewish ancestry. There is no known consanguinity.  GENETIC TEST RESULTS: Genetic testing performed through [facility]'s Common Hereditary Cancers Panel reported out on 04/22/2017 showed no pathogenic mutations. [facility] Common Hereditary Cancers Panel includes analysis of the following 46 genes: APC, ATM, AXIN2, BARD1, BMPR1A, BRCA1, BRCA2, BRIP1, CDH1, CDKN2A, CHEK2, CTNNA1, DICER1, EPCAM, GREM1, HOXB13, KIT, MEN1, MLH1, MSH2, MSH3, MSH6, MUTYH, NBN, NF1, NTHL1, PALB2, PDGFRA, PMS2, POLD1, POLE, PTEN, RAD50, RAD51C, RAD51D, SDHA, SDHB, SDHC, SDHD, SMAD4, SMARCA4, STK11, TP53, TSC1, TSC2, and VHL.  The test report will be scanned into EPIC and will be located under the Molecular Pathology section of the Results Review tab.A portion of the result report is included below for reference.    We discussed with Ms. Boulanger that since the current genetic testing is not perfect, it is possible  there may be a gene mutation in one of these genes that current testing cannot detect, but that chance is small. We also discussed, that it is possible that another gene that has not yet been discovered, or that we have not yet tested, is responsible for the cancer diagnoses in  the family. Therefore, important to remain in touch with cancer genetics in the future so that we can continue to offer Ms. Omlor the most up to date genetic testing.   CANCER SCREENING RECOMMENDATIONS: This result indicates that it is unlikely Ms. Llamas has an increased risk for a future cancer due to a mutation in one of these genes. However, given Ms. Taunton personal and family histories, we must interpret these negative results with some caution.  Families with features suggestive of hereditary risk for cancer tend to have multiple family members with cancer, diagnoses in multiple generations and diagnoses before the age of 67. Ms. Fiddler family exhibits some of these features. Thus this result may simply reflect our current inability to detect all mutations within these genes or there may be a different gene that has not yet been discovered or tested. However, because no causative or actionable mutations were identified, it is recommended she continue to follow the cancer management and screening guidelines provided by her oncology and primary healthcare provider.   RECOMMENDATIONS FOR FAMILY MEMBERS: Women in this family might be at some increased risk of developing breast cancer, over the general population risk, simply due to the family history of cancer. We recommended women in this family have a yearly mammogram beginning at age 52, or 29 years younger than the earliest onset of cancer, an annual clinical breast exam, and perform monthly breast self-exams. We specifically discussed that according to current guidelines, Ms. Milos daughters should begin annual mammograms at age 39 (67 years prior to her age at diagnosis). Women in this family should also have a gynecological exam as recommended by their primary provider. All family members should have a colonoscopy by age 28.   FOLLOW-UP: Lastly, we discussed with Ms. Vine that cancer genetics is a rapidly advancing field and  it is possible that new genetic tests will be appropriate for her and/or her family members in the future. We encouraged her to remain in contact with cancer genetics on an annual basis so we can update her personal and family histories and let her know of advances in cancer genetics that may benefit this family.   Our contact number was provided. Ms. Konczal questions were answered to her satisfaction, and she knows she is welcome to call us at anytime with additional questions or concerns.   Mal Misty, MS, [facility] Certified Naval [email or web address]

## 2017-05-17 NOTE — Telephone Encounter (Addendum)
Reviewed that germline genetic testing revealed no pathogenic mutations. This is considered to be a negative result. Testing was performed through [facility]'s 46-gene Common Hereditary Cancers Panel. [facility] Common Hereditary Cancers Panel includes analysis of the following 46 genes: APC, ATM, AXIN2, BARD1, BMPR1A, BRCA1, BRCA2, BRIP1, CDH1, CDKN2A, CHEK2, CTNNA1, DICER1, EPCAM, GREM1, HOXB13, KIT, MEN1, MLH1, MSH2, MSH3, MSH6, MUTYH, NBN, NF1, NTHL1, PALB2, PDGFRA, PMS2, POLD1, POLE, PTEN, RAD50, RAD51C, RAD51D, SDHA, SDHB, SDHC, SDHD, SMAD4, SMARCA4, STK11, TP53, TSC1, TSC2, and VHL.  For more detailed discussion, please see genetic counseling documentation from 05/11/2017. Result report dated 04/22/2017.

## 2017-05-18 ENCOUNTER — Telehealth: Payer: Self-pay | Admitting: *Deleted

## 2017-05-18 NOTE — Telephone Encounter (Signed)
Per Dr. Lindi Adie, referral sent to Dr. Iran Planas to discuss mastectomy with reconstruction.

## 2017-07-30 ENCOUNTER — Telehealth: Payer: Self-pay | Admitting: *Deleted

## 2017-07-30 NOTE — Telephone Encounter (Signed)
Spoke to pt regarding treatment decision. Pt relate she has decided to have surgery with Dr. Elisha Headland at [facility]. Informed pt that if she has needs to please call us at any time.

## 2017-10-07 HISTORY — PX: MASTECTOMY: SHX3

## 2017-10-20 HISTORY — PX: TISSUE EXPANDER PLACEMENT: SHX2530

## 2017-11-06 NOTE — Assessment & Plan Note (Deleted)
04/30/2017: Right lumpectomy: IDC grade 2, 1.3 cm, low-grade DCIS, superior margin and broadly positive, 0/2 lymph nodes negative, T1 CN 0 stage IA, ER 95%, PR 90%, HER-2 negative ratio 1.4, Ki-67 10%; reexcision superior margin: Benign breast tissue.  Oncotype DX score 8: 10 year risk of distant recurrence 6% with antiestrogen therapy alone, low risk, no need of chemotherapy  Treatment Recommendation: 1. Adjuvant radiation therapy (patient is reluctant to receive radiation.) She in fact wants to meet with plastic surgery to talk about the role of mastectomy and reconstruction. 2. adjuvant antiestrogen therapy with tamoxifen 20 mg daily 5 years. However patient is also not interested in taking tamoxifen because she does not want to add more medications. She understands that the risk of distant recurrence without tamoxifen therapy could be about 12% in 10 years.  Surveillance: Patient would likely need to breast MRIs every other year along with annual mammograms especially if she does not undergo radiation therapy. This is because her risk of local recurrence is more than 20% if she does not undergo adjuvant radiation or adjuvant antiestrogen treatments.  Return to clinic in 1 year for follow-up

## 2017-11-08 ENCOUNTER — Ambulatory Visit: Payer: BLUE CROSS/BLUE SHIELD | Admitting: Hematology and Oncology

## 2017-11-21 HISTORY — PX: BREAST IMPLANT REMOVAL: SUR1101

## 2018-02-21 HISTORY — PX: TISSUE EXPANDER PLACEMENT: SHX2530

## 2018-03-01 ENCOUNTER — Telehealth: Payer: Self-pay

## 2018-03-01 ENCOUNTER — Encounter: Payer: Self-pay | Admitting: Obstetrics & Gynecology

## 2018-03-01 ENCOUNTER — Ambulatory Visit (INDEPENDENT_AMBULATORY_CARE_PROVIDER_SITE_OTHER): Payer: BLUE CROSS/BLUE SHIELD | Admitting: Obstetrics & Gynecology

## 2018-03-01 VITALS — BP 126/80 | Temp 97.7°F

## 2018-03-01 DIAGNOSIS — Z170 Estrogen receptor positive status [ER+]: Secondary | ICD-10-CM

## 2018-03-01 DIAGNOSIS — N920 Excessive and frequent menstruation with regular cycle: Secondary | ICD-10-CM | POA: Diagnosis not present

## 2018-03-01 DIAGNOSIS — C50211 Malignant neoplasm of upper-inner quadrant of right female breast: Secondary | ICD-10-CM

## 2018-03-01 NOTE — Progress Notes (Signed)
    Carla Velez 1975-11-27 [id number]        42 y.o.  [id number]  Married.  2 girls  RP: Heavy bleeding  HPI: Rt Breast Ca ER pos Dxed 03/2017.  S/P Bilateral Mastectomy.  Planning Breast reconstruction in near future. Followed by Oncology in [place].  Heavy periods x several months.  Periods are every month lasting 5-7 days.  Heaviest flow 3 first days with blood clots.  Currently on day #2 of her period.  Hb at 9, then started on FeSO4 TID with an improvement of her Hb to 12 recently.  No blood in urine.  No blood around stools.  GE with Endoscopy/colonoscopy planned.   OB History  Gravida Para Term Preterm AB Living  2 1   1 1 2   SAB TAB Ectopic Multiple Live Births      0        # Outcome Date GA Lbr Len/2nd Weight Sex Delivery Anes PTL Lv  2 Preterm           1 AB             Past medical history,surgical history, problem list, medications, allergies, family history and social history were all reviewed and documented in the EPIC chart.   Directed ROS with pertinent positives and negatives documented in the history of present illness/assessment and plan.  Exam:  Vitals:   03/01/18 1250  BP: 126/80  Temp: 97.7 F (36.5 C)  TempSrc: Oral   General appearance:  Normal  Abdomen:  Normal  Gyn exam:  Vulva normal.  Speculum:  Cervix normal.  Vagina normal.  Dark blood ++ from Endocervix.  Bimanual exam:  Uterus AV, normal volume, mobile, NT.  No adnexal mass, NT.   Assessment/Plan:  42 y.o. [id number]   1. Menorrhagia with regular cycle Menorrhagia with secondary anemia, currently improved on iron supplements.  Recent diagnosis of breast cancer with estrogen/progesterone receptor positive, hormonal control of her menorrhagia therefore contraindicated.  Novasure Endometrial Ablation discussed.  Will rule out endometrial pathology such as polyps, submucosal fibroid, endometrial hyperplasia and endometrial cancer, with a follow-up pelvic ultrasound. - US Transvaginal Non-OB;  Future  2. Malignant neoplasm of upper-inner quadrant of right breast in female, estrogen receptor positive ([place]) Diagnosis in May 2019.  Estrogen and progesterone receptor positive.  Status post bilateral mastectomy.  Planning breast reconstruction in the near future.  Counseling on above issues and coordination of care more than 50% for 25 minutes.  [name] MD, 1:03 PM 03/01/2018

## 2018-03-01 NOTE — Telephone Encounter (Signed)
Patient called and front desk sent her to my voice mail at my request as I was scheduling surgery. She was in voice mail clearly upset and tearful. Said she was on Day 2 of menses and heaviest she ever has had. She just passed a palm sized clot and was very upset about that.  She said she was "trying not to freak out". She did mention that after her annual with Dr. Marguerita Merles last year she was diagnosed with breast cancer and I felt like that might be adding to her concern about this bleeding.  I asked CLaudia to schedule her today the sooner the better. Appt made.

## 2018-03-04 DIAGNOSIS — J9811 Atelectasis: Secondary | ICD-10-CM

## 2018-03-04 HISTORY — DX: Atelectasis: J98.11

## 2018-03-06 ENCOUNTER — Encounter: Payer: Self-pay | Admitting: Obstetrics & Gynecology

## 2018-03-06 NOTE — Patient Instructions (Signed)
1. Menorrhagia with regular cycle Menorrhagia with secondary anemia, currently improved on iron supplements.  Recent diagnosis of breast cancer with estrogen/progesterone receptor positive, hormonal control of her menorrhagia therefore contraindicated.  Novasure Endometrial Ablation discussed.  Will rule out endometrial pathology such as polyps, submucosal fibroid, endometrial hyperplasia and endometrial cancer, with a follow-up pelvic ultrasound. - US Transvaginal Non-OB; Future  2. Malignant neoplasm of upper-inner quadrant of right breast in female, estrogen receptor positive ([place]) Diagnosis in May 2019.  Estrogen and progesterone receptor positive.  Status post bilateral mastectomy.  Planning breast reconstruction in the near future.  Priscille Loveless un placer verle hoy!

## 2018-03-14 ENCOUNTER — Ambulatory Visit (INDEPENDENT_AMBULATORY_CARE_PROVIDER_SITE_OTHER): Payer: BLUE CROSS/BLUE SHIELD

## 2018-03-14 ENCOUNTER — Encounter: Payer: Self-pay | Admitting: Obstetrics & Gynecology

## 2018-03-14 ENCOUNTER — Ambulatory Visit: Payer: BLUE CROSS/BLUE SHIELD | Admitting: Obstetrics & Gynecology

## 2018-03-14 ENCOUNTER — Ambulatory Visit (INDEPENDENT_AMBULATORY_CARE_PROVIDER_SITE_OTHER): Payer: BLUE CROSS/BLUE SHIELD | Admitting: Obstetrics & Gynecology

## 2018-03-14 ENCOUNTER — Other Ambulatory Visit: Payer: Self-pay | Admitting: Obstetrics & Gynecology

## 2018-03-14 ENCOUNTER — Other Ambulatory Visit: Payer: BLUE CROSS/BLUE SHIELD

## 2018-03-14 DIAGNOSIS — C50211 Malignant neoplasm of upper-inner quadrant of right female breast: Secondary | ICD-10-CM

## 2018-03-14 DIAGNOSIS — N920 Excessive and frequent menstruation with regular cycle: Secondary | ICD-10-CM

## 2018-03-14 DIAGNOSIS — D219 Benign neoplasm of connective and other soft tissue, unspecified: Secondary | ICD-10-CM

## 2018-03-14 DIAGNOSIS — N939 Abnormal uterine and vaginal bleeding, unspecified: Secondary | ICD-10-CM | POA: Diagnosis not present

## 2018-03-14 DIAGNOSIS — Z170 Estrogen receptor positive status [ER+]: Secondary | ICD-10-CM

## 2018-03-14 DIAGNOSIS — D251 Intramural leiomyoma of uterus: Secondary | ICD-10-CM

## 2018-03-14 NOTE — Patient Instructions (Addendum)
1. Menorrhagia with regular cycle Menorrhagia with secondary anemia.  Probably due to uterine fibroids per pelvic ultrasound today.  Will continue with iron sulfate 3 times a day.  2. Fibroids Largest fibroid measuring 4.3 cm probably partly submucosal.  Will follow up for a sonohysterogram to evaluate the intrauterine cavity.  Will then probably proceed with a hysteroscopy with excision of the submucosal fibroid, D&C and endometrial ablation.  Pelvic ultrasound findings and plan discussed with patient.   - [place] Sonohysterogram; Future  3. Malignant neoplasm of upper-inner quadrant of right breast in female, estrogen receptor positive ([place]) Planning breast reconstruction in the near future.  Mardene Celeste, good seeing you today!   Sonohysterogram A sonohysterogram is a procedure to examine the inside of the uterus. This exam uses sound waves that are sent to a computer to make images of the lining of the uterus (endometrium). To get the best images, a germ-free, salt-water solution (sterile saline) is put into the uterus through the vagina. You may have this procedure if you have certain reproductive problems, such as abnormal bleeding, infertility, or miscarriage. This procedure can show what may be causing these problems. Possible causes include scarring or abnormal growths such as fibroids inside your uterus. It can also show if your uterus is an abnormal shape or if the lining of the uterus is too thin. Tell a health care provider about:  All medicines you are taking, including vitamins, herbs, eye drops, creams, and over-the-counter medicines.  Any allergies you have.  Any blood disorders you have.  Any surgeries you have had.  Any medical conditions you have.  Whether you are pregnant or may be pregnant.  The date of the first day of your last period.  Any signs of infection, such as fever, pain in your lower abdomen, or abnormal discharge from your vagina. What are the  risks? Generally, this is a safe procedure. However, problems may occur, including:  Abdominal pain or cramping.  Light bleeding (spotting).  Increased vaginal discharge.  Infection.  What happens before the procedure?  Your health care provider may have you take an over-the-counter pain medicine.  You may be given medicine to stop any abnormal bleeding.  You may be given antibiotic medicine to help prevent infection.  You may be asked to take a pregnancy test. This is usually in the form of a urine test.  You may have a pelvic exam.  You will be asked to empty your bladder. What happens during the procedure?  You will lie down on the exam table with your feet in stirrups or with your knees bent and your feet flat on the table.  A slender, handheld device (transducer) will be lubricated and placed into your vagina.  The transducer will be positioned to send sound waves to your uterus. The sound waves are sent to a computer and are turned into images, which your health care provider sees during the procedure.  The transducer will be removed from your vagina.  An instrument will be inserted to widen the opening of your vagina (speculum).  A swab with germ-killing solution (antiseptic) will be used to clean the opening to your uterus (cervix).  A long, thin tube (catheter) will be placed through your cervix into your uterus.  The speculum will be removed.  The transducer will be placed back into your vagina to take more images.  Your uterus will be filled with a germ-free, salt-water solution (sterile saline) through the catheter. You may feel some cramping.  A fluid that contains air bubbles may be sent through the catheter to make it easier to see the fallopian tubes.  The transducer and catheter will be removed. The procedure may vary among health care providers and hospitals. What happens after the procedure?  It is up to you to get the results of your procedure.  Ask your health care provider, or the department that is doing the procedure, when your results will be ready. Summary  A sonohysterogram is a procedure that creates images of the inside of the uterus.  The risks of this procedure are very low. Most women experience cramping and spotting after the procedure.  You may need to have a pelvic exam and take a pregnancy test before this procedure. This procedure will not be done if you are pregnant or have an infection. This information is not intended to replace advice given to you by your health care provider. Make sure you discuss any questions you have with your health care provider. Document Released: 03/26/2014 Document Revised: 10/05/2016 Document Reviewed: 10/05/2016 Elsevier Interactive Patient Education  2017 [employer].

## 2018-03-14 NOTE — Progress Notes (Signed)
    Carla Velez 03-31-1976 [id number]        42 y.o.  [id number] Married  RP: Menorrhagia with secondary anemia for Pelvic US  HPI: Heavy periods with secondary anemia.  Recent hemoglobin was 9 per patient.  Taking iron sulfate supplements 3 times a day.  Cannot be on hormonal treatment because of breast cancer.   OB History  Gravida Para Term Preterm AB Living  2 1   1 1 2   SAB TAB Ectopic Multiple Live Births      0        # Outcome Date GA Lbr Len/2nd Weight Sex Delivery Anes PTL Lv  2 Preterm           1 AB             Past medical history,surgical history, problem list, medications, allergies, family history and social history were all reviewed and documented in the EPIC chart.   Directed ROS with pertinent positives and negatives documented in the history of present illness/assessment and plan.  Exam:  There were no vitals filed for this visit. General appearance:  Normal  Pelvic US today: T/V and T/A images.  Anteverted uterus measuring 9.98 x 7.99 x 6.39 cm.  Endometrial line at 17.0 mm.  Uterine fibroids: posterior right intramural measuring 3.0 x 3.1 x 1.9 cm,  anterior partly intramural and submucosal measuring 4.3 x 2.9 x 4.0 cm.  Right and left ovaries normal.  No free fluid in the posterior cul-de-sac.   Assessment/Plan:  42 y.o. [id number]   1. Menorrhagia with regular cycle Menorrhagia with secondary anemia.  Probably due to uterine fibroids per pelvic ultrasound today.  Will continue with iron sulfate 3 times a day.  2. Fibroids Largest fibroid measuring 4.3 cm probably partly submucosal.  Will follow up for a sonohysterogram to evaluate the intrauterine cavity.  Will then probably proceed with a hysteroscopy with excision of the submucosal fibroid, D&C and endometrial ablation.  Pelvic ultrasound findings and plan discussed with patient.   - [place] Sonohysterogram; Future  3. Malignant neoplasm of upper-inner quadrant of right breast in female, estrogen  receptor positive ([place]) Planning breast reconstruction in the near future.  Counseling on above issues and coordination of care more than 50% for 15 minutes.  [name] MD, 9:26 AM 03/14/2018

## 2018-04-01 ENCOUNTER — Encounter: Payer: BLUE CROSS/BLUE SHIELD | Admitting: Obstetrics & Gynecology

## 2018-04-05 ENCOUNTER — Ambulatory Visit: Payer: BLUE CROSS/BLUE SHIELD | Admitting: Obstetrics & Gynecology

## 2018-04-05 ENCOUNTER — Other Ambulatory Visit: Payer: BLUE CROSS/BLUE SHIELD

## 2018-04-12 ENCOUNTER — Ambulatory Visit (INDEPENDENT_AMBULATORY_CARE_PROVIDER_SITE_OTHER): Payer: BLUE CROSS/BLUE SHIELD

## 2018-04-12 ENCOUNTER — Other Ambulatory Visit: Payer: Self-pay | Admitting: Obstetrics & Gynecology

## 2018-04-12 ENCOUNTER — Ambulatory Visit (INDEPENDENT_AMBULATORY_CARE_PROVIDER_SITE_OTHER): Payer: BLUE CROSS/BLUE SHIELD | Admitting: Obstetrics & Gynecology

## 2018-04-12 DIAGNOSIS — D250 Submucous leiomyoma of uterus: Secondary | ICD-10-CM

## 2018-04-12 DIAGNOSIS — D219 Benign neoplasm of connective and other soft tissue, unspecified: Secondary | ICD-10-CM

## 2018-04-12 DIAGNOSIS — N920 Excessive and frequent menstruation with regular cycle: Secondary | ICD-10-CM

## 2018-04-12 DIAGNOSIS — N939 Abnormal uterine and vaginal bleeding, unspecified: Secondary | ICD-10-CM | POA: Diagnosis not present

## 2018-04-12 DIAGNOSIS — R9389 Abnormal findings on diagnostic imaging of other specified body structures: Secondary | ICD-10-CM | POA: Diagnosis not present

## 2018-04-12 NOTE — Progress Notes (Signed)
    Carla Velez 12-May-1976 [id number]        42 y.o.  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  Not on any hormonal treatment because of her Dx of Breast Cancer.  Last Hb at 9.  On Iron sulfate TID.   OB History  Gravida Para Term Preterm AB Living  2 1   1 1 2   SAB TAB Ectopic Multiple Live Births      0        # Outcome Date GA Lbr Len/2nd Weight Sex Delivery Anes PTL Lv  2 Preterm           1 AB             Past medical history,surgical history, problem list, medications, allergies, family history and social history were all reviewed and documented in the EPIC chart.   Directed ROS with pertinent positives and negatives documented in the history of present illness/assessment and plan.  Exam:  There were no vitals filed for this visit. General appearance:  Normal                                                                    Sono Infusion Hysterogram ( procedure note)   The initial transvaginal ultrasound demonstrated the following: Uterus and ovaries unchanged since March 14, 2018 pelvic ultrasound.  Endometrial lining remeasured today at 13 mm.  The speculum  was inserted and the cervix cleansed with Betadine solution after confirming that patient has no allergies.A small sonohysterography catheterwas utilized.  Insertion was facilitated with ring forceps, using a spear-like motion the catheter was inserted to the fundus of the uterus. The speculum is then removed carefully to avoid dislodging the catheter. The catheter was flushed with sterile saline delete prior to insertion to rid it of small amounts of air.the sterile saline solution was infused into the uterine cavity as a vaginal ultrasound probe was then placed in the vagina for full visualization of the uterine cavity from a transvaginal approach. The following was noted: Following injection of saline the endometrium is filled showing an  anterior submucosal fibroid defect measured at 4.3 x 3.2 cm.  The endometrium, anteriorly and posteriorly, appears normal.  The catheter was then removed after retrieving some of the saline from the intrauterine cavity. An endometrial biopsy was not done. Patient tolerated procedure well. She had received a tablet of Aleve for discomfort.    Assessment/Plan:  42 y.o. [id number]   1. Thickened endometrium Endometrium, anterior and posterior, wnl per Sonohysterogram.  2. Menorrhagia with regular cycle Menorrhagia associated with a submucosal myoma with secondary anemia.  Patient will continue on iron sulfate 3 times a day.  No hormonal therapy given her diagnosis of breast cancer.  3. Fibroids, submucosal Anterior submucosal fibroid measuring 4.3 x 3.2 cm.  Schedule surgery, [place] Myosure resection of SM Myoma, D+C, Novasure Endometrial ablation.  Information given.  F/U Preop.  Patient agrees with plan.  Counseling on above issues and coordination of care more than 50% for 15 minutes.  [name] MD, 2:39 PM 04/12/2018

## 2018-04-16 ENCOUNTER — Encounter: Payer: Self-pay | Admitting: Obstetrics & Gynecology

## 2018-04-16 NOTE — Patient Instructions (Signed)
1. Thickened endometrium Endometrium, anterior and posterior, wnl per Sonohysterogram.  2. Menorrhagia with regular cycle Menorrhagia associated with a submucosal myoma with secondary anemia.  Patient will continue on iron sulfate 3 times a day.  No hormonal therapy given her diagnosis of breast cancer.  3. Fibroids, submucosal Anterior submucosal fibroid measuring 4.3 x 3.2 cm.  Schedule surgery, [place] Myosure resection of SM Myoma, D+C, Novasure Endometrial ablation.  Information given.  F/U Preop.  Patient agrees with plan.  Mardene Celeste, good seeing you today!

## 2018-04-22 ENCOUNTER — Ambulatory Visit (INDEPENDENT_AMBULATORY_CARE_PROVIDER_SITE_OTHER): Payer: BLUE CROSS/BLUE SHIELD | Admitting: Obstetrics & Gynecology

## 2018-04-22 ENCOUNTER — Encounter: Payer: BLUE CROSS/BLUE SHIELD | Admitting: Obstetrics & Gynecology

## 2018-04-22 ENCOUNTER — Encounter: Payer: Self-pay | Admitting: Obstetrics & Gynecology

## 2018-04-22 VITALS — BP 116/78 | Ht 66.0 in | Wt 149.0 lb

## 2018-04-22 DIAGNOSIS — Z170 Estrogen receptor positive status [ER+]: Secondary | ICD-10-CM | POA: Diagnosis not present

## 2018-04-22 DIAGNOSIS — D250 Submucous leiomyoma of uterus: Secondary | ICD-10-CM | POA: Diagnosis not present

## 2018-04-22 DIAGNOSIS — C50211 Malignant neoplasm of upper-inner quadrant of right female breast: Secondary | ICD-10-CM

## 2018-04-22 DIAGNOSIS — Z9189 Other specified personal risk factors, not elsewhere classified: Secondary | ICD-10-CM

## 2018-04-22 DIAGNOSIS — Z01419 Encounter for gynecological examination (general) (routine) without abnormal findings: Secondary | ICD-10-CM | POA: Diagnosis not present

## 2018-04-22 DIAGNOSIS — Z853 Personal history of malignant neoplasm of breast: Secondary | ICD-10-CM

## 2018-04-22 DIAGNOSIS — B373 Candidiasis of vulva and vagina: Secondary | ICD-10-CM

## 2018-04-22 DIAGNOSIS — B3731 Acute candidiasis of vulva and vagina: Secondary | ICD-10-CM

## 2018-04-22 LAB — WET PREP FOR TRICH, YEAST, CLUE

## 2018-04-22 MED ORDER — FLUCONAZOLE 150 MG PO TABS: 150.0000 mg | ORAL_TABLET | Freq: Every day | ORAL | 2 refills | Status: AC

## 2018-04-22 NOTE — Progress Notes (Signed)
Lolly Glaus [birth year] [id number]   History:    42 y.o. G3P2A1L2 Married.  Vasectomy  RP:  Established patient presenting for annual gyn exam   HPI: Heavy periods with a SM Myoma.  [place] Myosure Excision, D+C, Novasure Endometrial Ablation scheduled end of 04/2018.  No pelvic pain currently.  Frequent yeast vaginitis, treated with OTC anti-fungal this week, less itching/discharge currently.  No pain with IC.  Vasectomy.  Breast Ca post bilateral Mastectomy in process of breast reconstruction.  Urine/BMs normal. BMI 24.05.  Health labs with Fam MD.  Past medical history,surgical history, family history and social history were all reviewed and documented in the EPIC chart.  Gynecologic History Patient's last menstrual period was 03/31/2018. Contraception: vasectomy Last Pap: 03/2017. Results were: Negative, HPV HR neg Last mammogram: 03/2017.  Rt breast Ca. Bone Density: Never Colonoscopy: Never  Obstetric History OB History  Gravida Para Term Preterm AB Living  2 1   1 1 2   SAB TAB Ectopic Multiple Live Births      0        # Outcome Date GA Lbr Len/2nd Weight Sex Delivery Anes PTL Lv  2 Preterm           1 AB              ROS: A ROS was performed and pertinent positives and negatives are included in the history.  GENERAL: No fevers or chills. HEENT: No change in vision, no earache, sore throat or sinus congestion. NECK: No pain or stiffness. CARDIOVASCULAR: No chest pain or pressure. No palpitations. PULMONARY: No shortness of breath, cough or wheeze. GASTROINTESTINAL: No abdominal pain, nausea, vomiting or diarrhea, melena or bright red blood per rectum. GENITOURINARY: No urinary frequency, urgency, hesitancy or dysuria. MUSCULOSKELETAL: No joint or muscle pain, no back pain, no recent trauma. DERMATOLOGIC: No rash, no itching, no lesions. ENDOCRINE: No polyuria, polydipsia, no heat or cold intolerance. No recent change in weight. HEMATOLOGICAL: No anemia or easy bruising or  bleeding. NEUROLOGIC: No headache, seizures, numbness, tingling or weakness. PSYCHIATRIC: No depression, no loss of interest in normal activity or change in sleep pattern.     Exam:   BP 116/78   Ht 5\' 6"  (1.676 m)   Wt 149 lb (67.6 kg)   LMP 03/31/2018   BMI 24.05 kg/m   Body mass index is 24.05 kg/m.  General appearance : Well developed well nourished female. No acute distress HEENT: Eyes: no retinal hemorrhage or exudates,  Neck supple, trachea midline, no carotid bruits, no thyroidmegaly Lungs: Clear to auscultation, no rhonchi or wheezes, or rib retractions  Heart: Regular rate and rhythm, no murmurs or gallops Breast:Examined in sitting and supine position:  S/P Bilateral Mastectomy.  Left breast reconstruction done.  Right breast reconstruction in process (filling up, very hard).  No axillary or supraclavicular lymphadenopathy Abdomen: no palpable masses or tenderness, no rebound or guarding Extremities: no edema or skin discoloration or tenderness  Pelvic: Vulva: Normal             Vagina: No gross lesions or discharge.  Wet prep done.  Cervix: No gross lesions or discharge  Uterus  AV, normal size, shape and consistency, non-tender and mobile  Adnexa  Without masses or tenderness  Anus: Normal  Wet prep negative   Assessment/Plan:  42 y.o. female for annual exam   1. Well female exam with routine gynecological exam Normal gynecologic exam.  Last Pap May 2018 was negative with negative high  risk HPV.  2. Relies on partner vasectomy for contraception  3. Yeast vaginitis Wet prep negative but frequent recurrent yeast vaginitis.  Prescription of fluconazole 150 mg tablet 1 tablet per mouth daily for 3 days as needed sent to the pharmacy.  4. Submucous uterine fibroid Hysteroscopy with excision of submucosal myoma and D&C, as well as NovaSure endometrial ablation scheduled for end of June.  Surgery, risks and benefits reviewed with patient.  5. Malignant neoplasm of  upper-inner quadrant of right breast in female, estrogen receptor positive ([place]) Status post bilateral mastectomy.  Right breast reconstruction in progress.  Other orders - lactobacillus acidophilus (BACID) TABS tablet; Take 2 tablets by mouth 3 (three) times daily. - fluconazole (DIFLUCAN) 150 MG tablet; Take 1 tablet (150 mg total) by mouth daily for 3 days.  Counseling on above issues and coordination of care more than 50% for 10 minutes.  [name] MD, 9:20 AM 04/22/2018

## 2018-04-24 ENCOUNTER — Encounter: Payer: Self-pay | Admitting: Obstetrics & Gynecology

## 2018-04-24 NOTE — Patient Instructions (Signed)
1. Well female exam with routine gynecological exam Normal gynecologic exam.  Last Pap May 2018 was negative with negative high risk HPV.  2. Relies on partner vasectomy for contraception  3. Yeast vaginitis Wet prep negative but frequent recurrent yeast vaginitis.  Prescription of fluconazole 150 mg tablet 1 tablet per mouth daily for 3 days as needed sent to the pharmacy.  4. Submucous uterine fibroid Hysteroscopy with excision of submucosal myoma and D&C, as well as NovaSure endometrial ablation scheduled for end of June.  Surgery, risks and benefits reviewed with patient.  5. 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  Right breast reconstruction in progress.  Other orders - lactobacillus acidophilus (BACID) TABS tablet; Take 2 tablets by mouth 3 (three) times daily. - fluconazole (DIFLUCAN) 150 MG tablet; Take 1 tablet (150 mg total) by mouth daily for 3 days.  Mardene Celeste, good seeing you today!

## 2018-04-25 ENCOUNTER — Telehealth: Payer: Self-pay

## 2018-04-25 NOTE — Telephone Encounter (Signed)
I called patient and spoke with her that I need to reschedule her surgery on 05/17/18 to start at 9:45am  instead of the scheduled 7:30am currently. Advised check in time now will be 7:45am. Patient was agreeable and very nice about the change that will allow a 2 doctor case to go first.

## 2018-05-11 ENCOUNTER — Other Ambulatory Visit: Payer: Self-pay

## 2018-05-11 ENCOUNTER — Telehealth: Payer: Self-pay

## 2018-05-11 ENCOUNTER — Encounter (HOSPITAL_BASED_OUTPATIENT_CLINIC_OR_DEPARTMENT_OTHER): Payer: Self-pay

## 2018-05-11 NOTE — Progress Notes (Signed)
NO BP/IV RIGHT ARM

## 2018-05-11 NOTE — Progress Notes (Signed)
Spoke with:  Carla Velez NPO:  After Midnight, no gum, candy, or mints   Arrival time: [id number] Labs: UPT (CBC 05/13/2018 in epic) AM medications:  None Pre op orders: Yes Ride home:  Randall Hiss (husband) [phone]

## 2018-05-11 NOTE — Telephone Encounter (Signed)
I called patient to let her know that we have had to move her surgery up an hour earlier to 8:45am with check in now at 6:45 am. She was in agreement about this.

## 2018-05-13 ENCOUNTER — Encounter (HOSPITAL_COMMUNITY)
Admission: RE | Admit: 2018-05-13 | Discharge: 2018-05-13 | Disposition: A | Discharge status: Home / Self Care | Payer: BLUE CROSS/BLUE SHIELD | Source: Ambulatory Visit | Attending: Obstetrics & Gynecology | Admitting: Obstetrics & Gynecology

## 2018-05-13 DIAGNOSIS — Z882 Allergy status to sulfonamides status: Secondary | ICD-10-CM | POA: Diagnosis not present

## 2018-05-13 DIAGNOSIS — Z888 Allergy status to other drugs, medicaments and biological substances status: Secondary | ICD-10-CM | POA: Diagnosis not present

## 2018-05-13 DIAGNOSIS — D250 Submucous leiomyoma of uterus: Secondary | ICD-10-CM | POA: Diagnosis not present

## 2018-05-13 DIAGNOSIS — N920 Excessive and frequent menstruation with regular cycle: Secondary | ICD-10-CM | POA: Diagnosis not present

## 2018-05-13 DIAGNOSIS — Z01812 Encounter for preprocedural laboratory examination: Secondary | ICD-10-CM | POA: Insufficient documentation

## 2018-05-13 DIAGNOSIS — Z853 Personal history of malignant neoplasm of breast: Secondary | ICD-10-CM | POA: Diagnosis not present

## 2018-05-13 DIAGNOSIS — Z881 Allergy status to other antibiotic agents status: Secondary | ICD-10-CM | POA: Diagnosis not present

## 2018-05-13 DIAGNOSIS — D649 Anemia, unspecified: Secondary | ICD-10-CM | POA: Diagnosis not present

## 2018-05-13 LAB — CBC
HCT: 38.4 % (ref 36.0–46.0)
HEMOGLOBIN: 12.4 g/dL (ref 12.0–15.0)
MCH: 27.9 pg (ref 26.0–34.0)
MCHC: 32.3 g/dL (ref 30.0–36.0)
MCV: 86.5 fL (ref 78.0–100.0)
Platelets: 174 10*3/uL (ref 150–400)
RBC: 4.44 MIL/uL (ref 3.87–5.11)
RDW: 16.1 % — ABNORMAL HIGH (ref 11.5–15.5)
WBC: 5.5 10*3/uL (ref 4.0–10.5)

## 2018-05-16 NOTE — Pre-Procedure Instructions (Signed)
CBC results 05/13/2018 faxed to Dr. Dellis Filbert via epic.

## 2018-05-17 ENCOUNTER — Ambulatory Visit (HOSPITAL_BASED_OUTPATIENT_CLINIC_OR_DEPARTMENT_OTHER): Payer: BLUE CROSS/BLUE SHIELD | Admitting: Anesthesiology

## 2018-05-17 ENCOUNTER — Encounter (HOSPITAL_BASED_OUTPATIENT_CLINIC_OR_DEPARTMENT_OTHER)
Admission: RE | Disposition: A | Discharge status: Home / Self Care | Payer: Self-pay | Source: Ambulatory Visit | Attending: Obstetrics & Gynecology

## 2018-05-17 ENCOUNTER — Encounter (HOSPITAL_BASED_OUTPATIENT_CLINIC_OR_DEPARTMENT_OTHER): Payer: Self-pay

## 2018-05-17 ENCOUNTER — Ambulatory Visit (HOSPITAL_BASED_OUTPATIENT_CLINIC_OR_DEPARTMENT_OTHER)
Admission: RE | Admit: 2018-05-17 | Discharge: 2018-05-17 | Disposition: A | Discharge status: Home / Self Care | Payer: BLUE CROSS/BLUE SHIELD | Source: Ambulatory Visit | Attending: Obstetrics & Gynecology | Admitting: Obstetrics & Gynecology

## 2018-05-17 DIAGNOSIS — Z882 Allergy status to sulfonamides status: Secondary | ICD-10-CM | POA: Insufficient documentation

## 2018-05-17 DIAGNOSIS — D500 Iron deficiency anemia secondary to blood loss (chronic): Secondary | ICD-10-CM | POA: Diagnosis not present

## 2018-05-17 DIAGNOSIS — N920 Excessive and frequent menstruation with regular cycle: Secondary | ICD-10-CM | POA: Insufficient documentation

## 2018-05-17 DIAGNOSIS — D649 Anemia, unspecified: Secondary | ICD-10-CM | POA: Insufficient documentation

## 2018-05-17 DIAGNOSIS — D250 Submucous leiomyoma of uterus: Secondary | ICD-10-CM | POA: Diagnosis not present

## 2018-05-17 DIAGNOSIS — Z881 Allergy status to other antibiotic agents status: Secondary | ICD-10-CM | POA: Insufficient documentation

## 2018-05-17 DIAGNOSIS — Z888 Allergy status to other drugs, medicaments and biological substances status: Secondary | ICD-10-CM | POA: Insufficient documentation

## 2018-05-17 DIAGNOSIS — Z853 Personal history of malignant neoplasm of breast: Secondary | ICD-10-CM | POA: Insufficient documentation

## 2018-05-17 HISTORY — DX: Hypotension, unspecified: I95.9

## 2018-05-17 HISTORY — DX: Atelectasis: J98.11

## 2018-05-17 HISTORY — PX: HYSTEROSCOPY WITH NOVASURE: SHX5574

## 2018-05-17 HISTORY — DX: Other specified postprocedural states: Z98.890

## 2018-05-17 HISTORY — DX: Localized swelling, mass and lump, neck: R22.1

## 2018-05-17 HISTORY — PX: DILATATION & CURETTAGE/HYSTEROSCOPY WITH MYOSURE: SHX6511

## 2018-05-17 HISTORY — DX: Submucous leiomyoma of uterus: D25.0

## 2018-05-17 HISTORY — DX: Personal history of other diseases of the respiratory system: Z87.09

## 2018-05-17 HISTORY — DX: Cellulitis of left lower limb: L03.116

## 2018-05-17 HISTORY — DX: Iron deficiency anemia, unspecified: D50.9

## 2018-05-17 HISTORY — DX: Other specified postprocedural states: R11.2

## 2018-05-17 HISTORY — DX: Mastitis without abscess: N61.0

## 2018-05-17 HISTORY — DX: Migraine, unspecified, not intractable, without status migrainosus: G43.909

## 2018-05-17 LAB — POCT PREGNANCY, URINE: Preg Test, Ur: NEGATIVE

## 2018-05-17 SURGERY — DILATATION & CURETTAGE/HYSTEROSCOPY WITH MYOSURE
Anesthesia: General

## 2018-05-17 MED ORDER — PROPOFOL 10 MG/ML IV BOLUS
INTRAVENOUS | Status: DC | PRN
  Administered 2018-05-17: 150 mg via INTRAVENOUS

## 2018-05-17 MED ORDER — ONDANSETRON HCL 4 MG/2ML IJ SOLN
INTRAMUSCULAR | Status: AC
  Filled 2018-05-17: qty 2

## 2018-05-17 MED ORDER — SILVER NITRATE-POT NITRATE 75-25 % EX MISC
CUTANEOUS | Status: DC | PRN
  Administered 2018-05-17: 2

## 2018-05-17 MED ORDER — DEXAMETHASONE SODIUM PHOSPHATE 10 MG/ML IJ SOLN
INTRAMUSCULAR | Status: AC
Start: 2018-05-17 — End: ?
  Filled 2018-05-17: qty 1

## 2018-05-17 MED ORDER — ONDANSETRON HCL 4 MG/2ML IJ SOLN
INTRAMUSCULAR | Status: DC | PRN
  Administered 2018-05-17: 4 mg via INTRAVENOUS

## 2018-05-17 MED ORDER — LIDOCAINE 2% (20 MG/ML) 5 ML SYRINGE
INTRAMUSCULAR | Status: AC
  Filled 2018-05-17: qty 5

## 2018-05-17 MED ORDER — FENTANYL CITRATE (PF) 100 MCG/2ML IJ SOLN
25.0000 ug | INTRAMUSCULAR | Status: DC | PRN
  Filled 2018-05-17: qty 1

## 2018-05-17 MED ORDER — LACTATED RINGERS IV SOLN
INTRAVENOUS | Status: DC
  Filled 2018-05-17: qty 1000

## 2018-05-17 MED ORDER — PROPOFOL 10 MG/ML IV BOLUS
INTRAVENOUS | Status: AC
Start: 2018-05-17 — End: ?
  Filled 2018-05-17: qty 20

## 2018-05-17 MED ORDER — MIDAZOLAM HCL 2 MG/2ML IJ SOLN
0.5000 mg | Freq: Once | INTRAMUSCULAR | Status: DC | PRN
  Filled 2018-05-17: qty 2

## 2018-05-17 MED ORDER — PHENYLEPHRINE 40 MCG/ML (10ML) SYRINGE FOR IV PUSH (FOR BLOOD PRESSURE SUPPORT)
PREFILLED_SYRINGE | INTRAVENOUS | Status: DC | PRN
  Administered 2018-05-17 (×2): 80 ug via INTRAVENOUS
  Administered 2018-05-17 (×2): 40 ug via INTRAVENOUS
  Administered 2018-05-17: 80 ug via INTRAVENOUS

## 2018-05-17 MED ORDER — LACTATED RINGERS IV SOLN
INTRAVENOUS | Status: DC
  Administered 2018-05-17: 10:00:00 via INTRAVENOUS
  Administered 2018-05-17: 1000 mL via INTRAVENOUS
  Filled 2018-05-17: qty 1000

## 2018-05-17 MED ORDER — LIDOCAINE 2% (20 MG/ML) 5 ML SYRINGE
INTRAMUSCULAR | Status: DC | PRN
  Administered 2018-05-17: 60 mg via INTRAVENOUS

## 2018-05-17 MED ORDER — CEFAZOLIN SODIUM-DEXTROSE 2-4 GM/100ML-% IV SOLN
INTRAVENOUS | Status: AC
  Filled 2018-05-17: qty 100

## 2018-05-17 MED ORDER — KETOROLAC TROMETHAMINE 30 MG/ML IJ SOLN
INTRAMUSCULAR | Status: AC
  Filled 2018-05-17: qty 1

## 2018-05-17 MED ORDER — MIDAZOLAM HCL 2 MG/2ML IJ SOLN
INTRAMUSCULAR | Status: AC
  Filled 2018-05-17: qty 2

## 2018-05-17 MED ORDER — FENTANYL CITRATE (PF) 100 MCG/2ML IJ SOLN
INTRAMUSCULAR | Status: DC | PRN
  Administered 2018-05-17: 50 ug via INTRAVENOUS
  Administered 2018-05-17 (×2): 25 ug via INTRAVENOUS

## 2018-05-17 MED ORDER — SCOPOLAMINE 1 MG/3DAYS TD PT72
1.0000 | MEDICATED_PATCH | Freq: Once | TRANSDERMAL | Status: DC
  Administered 2018-05-17: 1.5 mg via TRANSDERMAL
  Filled 2018-05-17: qty 1

## 2018-05-17 MED ORDER — PROMETHAZINE HCL 25 MG/ML IJ SOLN
6.2500 mg | INTRAMUSCULAR | Status: DC | PRN
  Filled 2018-05-17: qty 1

## 2018-05-17 MED ORDER — FENTANYL CITRATE (PF) 100 MCG/2ML IJ SOLN
INTRAMUSCULAR | Status: AC
  Filled 2018-05-17: qty 2

## 2018-05-17 MED ORDER — DEXAMETHASONE SODIUM PHOSPHATE 10 MG/ML IJ SOLN
INTRAMUSCULAR | Status: DC | PRN
  Administered 2018-05-17: 10 mg via INTRAVENOUS

## 2018-05-17 MED ORDER — CEFAZOLIN SODIUM-DEXTROSE 2-4 GM/100ML-% IV SOLN
2.0000 g | INTRAVENOUS | Status: AC
  Administered 2018-05-17: 2 g via INTRAVENOUS
  Filled 2018-05-17: qty 100

## 2018-05-17 MED ORDER — KETOROLAC TROMETHAMINE 30 MG/ML IJ SOLN
INTRAMUSCULAR | Status: DC | PRN
  Administered 2018-05-17: 30 mg via INTRAVENOUS

## 2018-05-17 MED ORDER — MIDAZOLAM HCL 2 MG/2ML IJ SOLN
INTRAMUSCULAR | Status: DC | PRN
  Administered 2018-05-17: 2 mg via INTRAVENOUS

## 2018-05-17 MED ORDER — CHLOROPROCAINE HCL 1 % IJ SOLN
INTRAMUSCULAR | Status: DC | PRN
  Administered 2018-05-17: 20 mL

## 2018-05-17 MED ORDER — SCOPOLAMINE 1 MG/3DAYS TD PT72
MEDICATED_PATCH | TRANSDERMAL | Status: AC
  Filled 2018-05-17: qty 1

## 2018-05-17 MED ORDER — MEPERIDINE HCL 25 MG/ML IJ SOLN
6.2500 mg | INTRAMUSCULAR | Status: DC | PRN
  Filled 2018-05-17: qty 1

## 2018-05-17 MED ORDER — PHENYLEPHRINE 40 MCG/ML (10ML) SYRINGE FOR IV PUSH (FOR BLOOD PRESSURE SUPPORT)
PREFILLED_SYRINGE | INTRAVENOUS | Status: AC
  Filled 2018-05-17: qty 10

## 2018-05-17 SURGICAL SUPPLY — 26 items
ABLATOR ENDOMETRIAL BIPOLAR (ABLATOR) ×3 IMPLANT
CANISTER SUCT 3000ML PPV (MISCELLANEOUS) ×3 IMPLANT
CATH ROBINSON RED A/P 16FR (CATHETERS) ×3 IMPLANT
CONTAINER PREFILL 10% NBF 60ML (FORM) IMPLANT
COUNTER NEEDLE 1200 MAGNETIC (NEEDLE) ×3 IMPLANT
DEVICE MYOSURE LITE (MISCELLANEOUS) IMPLANT
DEVICE MYOSURE REACH (MISCELLANEOUS) ×2 IMPLANT
DILATOR CANAL MILEX (MISCELLANEOUS) IMPLANT
ELECT REM PT RETURN 9FT ADLT (ELECTROSURGICAL)
ELECTRODE REM PT RTRN 9FT ADLT (ELECTROSURGICAL) IMPLANT
FILTER ARTHROSCOPY CONVERTOR (FILTER) ×3 IMPLANT
GLOVE BIO SURGEON STRL SZ 6.5 (GLOVE) ×2 IMPLANT
GLOVE BIO SURGEONS STRL SZ 6.5 (GLOVE) ×1
GLOVE BIOGEL PI IND STRL 7.0 (GLOVE) ×2 IMPLANT
GLOVE BIOGEL PI INDICATOR 7.0 (GLOVE) ×4
GOWN STRL REUS W/TWL LRG LVL3 (GOWN DISPOSABLE) ×6 IMPLANT
IV NS IRRIG 3000ML ARTHROMATIC (IV SOLUTION) ×6 IMPLANT
MYOSURE XL FIBROID REM (MISCELLANEOUS)
PACK VAGINAL MINOR WOMEN LF (CUSTOM PROCEDURE TRAY) ×3 IMPLANT
PAD OB MATERNITY 4.3X12.25 (PERSONAL CARE ITEMS) ×3 IMPLANT
PAD PREP 24X48 CUFFED NSTRL (MISCELLANEOUS) ×3 IMPLANT
SEAL ROD LENS SCOPE MYOSURE (ABLATOR) ×3 IMPLANT
SYSTEM TISS REMOVAL MYSR XL RM (MISCELLANEOUS) IMPLANT
TOWEL OR 17X24 6PK STRL BLUE (TOWEL DISPOSABLE) ×6 IMPLANT
TUBING AQUILEX INFLOW (TUBING) ×3 IMPLANT
TUBING AQUILEX OUTFLOW (TUBING) ×3 IMPLANT

## 2018-05-17 NOTE — Anesthesia Preprocedure Evaluation (Addendum)
Anesthesia Evaluation  Patient identified by MRN, date of birth, ID band Patient awake    Reviewed: Allergy & Precautions, NPO status , Patient's Chart, lab work & pertinent test results  History of Anesthesia Complications (+) PONV  Airway Mallampati: II  TM Distance: >3 FB Neck ROM: Full    Dental  (+) Dental Advisory Given   Pulmonary neg pulmonary ROS,    breath sounds clear to auscultation       Cardiovascular negative cardio ROS   Rhythm:Regular Rate:Normal     Neuro/Psych  Headaches, Anxiety    GI/Hepatic negative GI ROS, Neg liver ROS,   Endo/Other  negative endocrine ROS  Renal/GU negative Renal ROS     Musculoskeletal   Abdominal   Peds  Hematology negative hematology ROS (+)   Anesthesia Other Findings H/o breast cancer  Reproductive/Obstetrics                            Anesthesia Physical Anesthesia Plan  ASA: II  Anesthesia Plan: General   Post-op Pain Management:    Induction: Intravenous  PONV Risk Score and Plan: 4 or greater and Ondansetron, Dexamethasone and Scopolamine patch - Pre-op  Airway Management Planned: LMA  Additional Equipment:   Intra-op Plan:   Post-operative Plan:   Informed Consent: I have reviewed the patients History and Physical, chart, labs and discussed the procedure including the risks, benefits and alternatives for the proposed anesthesia with the patient or authorized representative who has indicated his/her understanding and acceptance.   Dental advisory given  Plan Discussed with: CRNA and Surgeon  Anesthesia Plan Comments: (Plan routine monitors, GA- LMA OK)        Anesthesia Quick Evaluation

## 2018-05-17 NOTE — Discharge Instructions (Addendum)
Hysteroscopy, Care After Refer to this sheet in the next few weeks. These instructions provide you with information on caring for yourself after your procedure. Your health care provider may also give you more specific instructions. Your treatment has been planned according to current medical practices, but problems sometimes occur. Call your health care provider if you have any problems or questions after your procedure. What can I expect after the procedure? After your procedure, it is typical to have the following:  You may have some cramping. This normally lasts for a couple days.  You may have bleeding. This can vary from light spotting for a few days to menstrual-like bleeding for 3-7 days.  Follow these instructions at home:  Rest for the first 1-2 days after the procedure.  Only take over-the-counter or prescription medicines as directed by your health care provider. Do not take aspirin. It can increase the chances of bleeding.  Take showers instead of baths for 2 weeks or as directed by your health care provider.  Do not drive for 24 hours or as directed.  Do not drink alcohol while taking pain medicine.  Do not use tampons, douche, or have sexual intercourse for 2 weeks or until your health care provider says it is okay.  Take your temperature twice a day for 4-5 days. Write it down each time.  Follow your health care provider's advice about diet, exercise, and lifting.  If you develop constipation, you may: ? Take a mild laxative if your health care provider approves. ? Add bran foods to your diet. ? Drink enough fluids to keep your urine clear or pale yellow.  Try to have someone with you or available to you for the first 24-48 hours, especially if you were given a general anesthetic.  Follow up with your health care provider as directed. Contact a health care provider if:  You feel dizzy or lightheaded.  You feel sick to your stomach (nauseous).  You have  abnormal vaginal discharge.  You have a rash.  You have pain that is not controlled with medicine. Get help right away if:  You have bleeding that is heavier than a normal menstrual period.  You have a fever.  You have increasing cramps or pain, not controlled with medicine.  You have new belly (abdominal) pain.  You pass out.  You have pain in the tops of your shoulders (shoulder strap areas).  You have shortness of breath. This information is not intended to replace advice given to you by your health care provider. Make sure you discuss any questions you have with your health care provider. Document Released: 08/30/2013 Document Revised: 04/16/2016 Document Reviewed: 06/08/2013 Elsevier Interactive Patient Education  2017 [place] [facility] Instructions  Activity: Get plenty of rest for the remainder of the day. A responsible individual must stay with you for 24 hours following the procedure.  For the next 24 hours, DO NOT: -Drive a car -Paediatric nurse -Drink alcoholic beverages -Take any medication unless instructed by your physician -Make any legal decisions or sign important papers.  Meals: Start with liquid foods such as gelatin or soup. Progress to regular foods as tolerated. Avoid greasy, spicy, heavy foods. If nausea and/or vomiting occur, drink only clear liquids until the nausea and/or vomiting subsides. Call your physician if vomiting continues.  Special Instructions/Symptoms: Your throat may feel dry or sore from the anesthesia or the breathing tube placed in your throat during surgery. If this causes discomfort, gargle  with warm salt water. The discomfort should disappear within 24 hours. ° °If you had a scopolamine patch placed behind your ear for the management of post- operative nausea and/or vomiting: ° °1. The medication in the patch is effective for 72 hours, after which it should be removed.  Wrap patch in a tissue and discard in  the trash. Wash hands thoroughly with soap and water. °2. You may remove the patch earlier than 72 hours if you experience unpleasant side effects which may include dry mouth, dizziness or visual disturbances. °3. Avoid touching the patch. Wash your hands with soap and water after contact with the patch. °  ° ° °

## 2018-05-17 NOTE — Transfer of Care (Signed)
Immediate Anesthesia Transfer of Care Note  Patient: Carla Velez  Procedure(s) Performed: DILATATION & CURETTAGE/HYSTEROSCOPY WITH MYOSURE (N/A ) ATTEMPTED HYSTEROSCOPY WITH NOVASURE (N/A )  Patient Location: PACU  Anesthesia Type:General  Level of Consciousness: awake, alert , oriented and patient cooperative  Airway & Oxygen Therapy: Patient Spontanous Breathing and Patient connected to nasal cannula oxygen  Post-op Assessment: Report given to RN and Post -op Vital signs reviewed and stable  Post vital signs: Reviewed and stable  Last Vitals:  Vitals Value Taken Time  BP 102/66 05/17/2018  9:53 AM  Temp 36.4 C 05/17/2018  9:53 AM  Pulse 75 05/17/2018  9:54 AM  Resp 13 05/17/2018  9:54 AM  SpO2 100 % 05/17/2018  9:54 AM  Vitals shown include unvalidated device data.  Last Pain:  Vitals:   05/17/18 0752  TempSrc:   PainSc: 0-No pain      Patients Stated Pain Goal: 7 (26/37/85 8850)  Complications: No apparent anesthesia complications

## 2018-05-17 NOTE — Anesthesia Procedure Notes (Signed)
Procedure Name: LMA Insertion Date/Time: 05/17/2018 9:05 AM Performed by: Raenette Rover, CRNA Pre-anesthesia Checklist: Patient identified, Emergency Drugs available, Suction available and Patient being monitored Patient Re-evaluated:Patient Re-evaluated prior to induction Oxygen Delivery Method: Circle system utilized Preoxygenation: Pre-oxygenation with 100% oxygen Induction Type: IV induction Ventilation: Mask ventilation without difficulty LMA: LMA inserted LMA Size: 4.0 Number of attempts: 1 Placement Confirmation: positive ETCO2,  CO2 detector and breath sounds checked- equal and bilateral Tube secured with: Tape Dental Injury: Teeth and Oropharynx as per pre-operative assessment

## 2018-05-17 NOTE — H&P (Signed)
Carla Velez is an 42 y.o. female. G2P2L2   RP: Menorrhagia with SM Myoma for HSC, Myosure excision, D+C, Novasure Endometrial Ablation  HPI: Very heavy periods.  Not on any hormonal treatment because of her Dx of Breast Cancer.  Last Hb at 9.  On Iron sulfate TID.  Pertinent Gynecological History: Menses: flow is excessive with use of many pads or tampons on heaviest days Contraception: vasectomy Blood transfusions: none Sexually transmitted diseases: None Last mammogram: Abnormal  Last pap: normal    Menstrual History: Patient's last menstrual period was 04/28/2018 (exact date).    Past Medical History:  Diagnosis Date  . Allergy   . Anxiety   . Atelectasis of left lung 03/04/2018   CXR at [facility]  . Breast cancer [facility]) 04/07/2017   right breast  . Cellulitis of foot, left 04/2012   due to bug bite  . Cellulitis of right breast   . Family history of adverse reaction to anesthesia    maternal GM---woke up during surgery  . Genetic testing 05/11/2017   Ms. Emry underwent genetic counseling and testing for hereditary cancer syndromes on 04/15/2017. Her results were negative for mutations in all 46 genes analyzed by [facility]'s 46-gene Common Hereditary Cancers Panel. Genes analyzed include: APC, ATM, AXIN2, BARD1, BMPR1A, BRCA1, BRCA2, BRIP1, CDH1, CDKN2A, CHEK2, CTNNA1, DICER1, EPCAM, GREM1, HOXB13, KIT, MEN1, MLH1, MSH2, MSH3, MSH6, MUTYH, NB  . History of bronchitis 08/2013  . History of kidney stones   . Iron deficiency anemia   . Low blood pressure   . Meibomian gland dysfunction   . Migraines   . Neck nodule    Left. has resolved  . Neuromuscular disorder ([place])    under arm right after breast surgry numbness, resolving  . PONV (postoperative nausea and vomiting)    Scop patch has helped in the past, Blood pressure drops very low during and after surgery  . Submucous uterine fibroid     Past Surgical History:  Procedure Laterality Date  . BREAST CYST  ASPIRATION    . BREAST IMPLANT REMOVAL Right 11/21/2017   Excision of wound  . BREAST LUMPECTOMY WITH RADIOACTIVE SEED AND SENTINEL LYMPH NODE BIOPSY Right 04/29/2017   Procedure: BREAST LUMPECTOMY WITH RADIOACTIVE SEED AND SENTINEL LYMPH NODE BIOPSY;  Surgeon: Erroll Luna, MD;  Location: [facility];  Service: General;  Laterality: Right;  . DILATION AND CURETTAGE OF UTERUS    . MASTECTOMY Bilateral 10/07/2017  . RE-EXCISION OF BREAST LUMPECTOMY Right 05/10/2017   Procedure: RE-EXCISION OF BREAST LUMPECTOMY;  Surgeon: Erroll Luna, MD;  Location: [facility];  Service: General;  Laterality: Right;  . TISSUE EXPANDER PLACEMENT  10/20/2017  . TISSUE EXPANDER PLACEMENT Right 02/2018  . TONSILLECTOMY      Family History  Problem Relation Age of Onset  . Breast cancer Mother 47  . Heart disease Father   . Kidney failure Father   . Macrosomia Maternal Grandmother   . Macular degeneration Maternal Grandmother   . Arthritis Maternal Grandmother   . Heart disease Maternal Grandfather   . Kidney disease Paternal Grandfather     Social History:  reports that she has never smoked. She has never used smokeless tobacco. She reports that she drinks alcohol. She reports that she does not use drugs.  Allergies:  Allergies  Allergen Reactions  . Chloraprep One Step [Chlorhexidine Gluconate] Rash  . Erythromycin Hives  . Bactrim [Sulfamethoxazole-Trimethoprim] Other (See Comments)    Joint pain and stiffness  .  Piperacillin Sod-Tazobactam So   . Silicone Hives    plugs  . Sulfa Antibiotics     Medications Prior to Admission  Medication Sig Dispense Refill Last Dose  . ARTIFICIAL TEAR OP Apply to eye as needed.   Past Month at Unknown time  . IRON PO Take 1 tablet by mouth 3 (three) times daily.   05/16/2018 at Unknown time  . Multiple Vitamin (MULTIVITAMIN) capsule Take 1 capsule by mouth daily.   05/16/2018 at Unknown time  . Omega-3 Fatty Acids (FISH OIL  OMEGA-3 PO) Take by mouth.   05/16/2018 at Unknown time  . ibuprofen (ADVIL,MOTRIN) 800 MG tablet Take 1 tablet (800 mg total) by mouth every 8 (eight) hours as needed. 30 tablet 0 More than a month at Unknown time    REVIEW OF SYSTEMS: A ROS was performed and pertinent positives and negatives are included in the history.  GENERAL: No fevers or chills. HEENT: No change in vision, no earache, sore throat or sinus congestion. NECK: No pain or stiffness. CARDIOVASCULAR: No chest pain or pressure. No palpitations. PULMONARY: No shortness of breath, cough or wheeze. GASTROINTESTINAL: No abdominal pain, nausea, vomiting or diarrhea, melena or bright red blood per rectum. GENITOURINARY: No urinary frequency, urgency, hesitancy or dysuria. MUSCULOSKELETAL: No joint or muscle pain, no back pain, no recent trauma. DERMATOLOGIC: No rash, no itching, no lesions. ENDOCRINE: No polyuria, polydipsia, no heat or cold intolerance. No recent change in weight. HEMATOLOGICAL: No anemia or easy bruising or bleeding. NEUROLOGIC: No headache, seizures, numbness, tingling or weakness. PSYCHIATRIC: No depression, no loss of interest in normal activity or change in sleep pattern.     Blood pressure 105/68, pulse 69, temperature 98.6 F (37 C), temperature source Oral, resp. rate 16, height 5' 6"  (1.676 m), weight 141 lb 3.2 oz (64 kg), last menstrual period 04/28/2018, SpO2 100 %.  Physical Exam:  See office notes   Results for orders placed or performed during the hospital encounter of 05/17/18 (from the past 24 hour(s))  Pregnancy, urine POC     Status: None   Collection Time: 05/17/18  7:39 AM  Result Value Ref Range   Preg Test, Ur NEGATIVE NEGATIVE   SonoHystero on 04/12/2018: Following injection of saline the endometrium is filled showing an anterior submucosal fibroid defect measured at 4.3 x 3.2 cm.  The endometrium, anteriorly and posteriorly, appears normal.   Assessment/Plan:  42 y.o. [id number]   1.  Thickened endometrium Endometrium, anterior and posterior, wnl per Sonohysterogram.  2. Menorrhagia with regular cycle Menorrhagia associated with a submucosal myoma with secondary anemia.  Patient will continue on iron sulfate 3 times a day.  No hormonal therapy given her diagnosis of breast cancer.  3. Fibroids, submucosal Anterior submucosal fibroid measuring 4.3 x 3.2 cm.  Schedule surgery, [place] Myosure resection of SM Myoma, D+C, Novasure Endometrial ablation.                          Patient was counseled as to the risk of surgery to include the following:  1. Infection (prohylactic antibiotics will be administered)  2. DVT/Pulmonary Embolism (prophylactic pneumo compression stockings will be used)  3.Trauma to internal organs requiring additional surgical procedure to repair any injury to internal organs requiring perhaps additional hospitalization days.  4.Hemmorhage requiring transfusion and blood products which carry risks such as  anaphylactic reaction, hepatitis and AIDS  Patient had received literature information on the procedure scheduled and all her questions  were answered and fully accepts all risk.  Carla Velez 05/17/2018, 8:39 AM

## 2018-05-17 NOTE — Anesthesia Postprocedure Evaluation (Signed)
Anesthesia Post Note  Patient: Kendra Grissett  Procedure(s) Performed: DILATATION & CURETTAGE/HYSTEROSCOPY WITH MYOSURE (N/A ) ATTEMPTED HYSTEROSCOPY WITH NOVASURE (N/A )     Patient location during evaluation: PACU Anesthesia Type: General Level of consciousness: awake and alert, oriented and patient cooperative Pain management: pain level controlled Vital Signs Assessment: post-procedure vital signs reviewed and stable Respiratory status: spontaneous breathing, nonlabored ventilation and respiratory function stable Cardiovascular status: blood pressure returned to baseline and stable Postop Assessment: no apparent nausea or vomiting Anesthetic complications: no    Last Vitals:  Vitals:   05/17/18 0953 05/17/18 1000  BP: 102/66 100/63  Pulse: 77 65  Resp: (!) 7 (!) 9  Temp: (!) 36.4 C   SpO2: 100% 100%    Last Pain:  Vitals:   05/17/18 0953  TempSrc:   PainSc: 0-No pain                 Anavi Branscum,E. Chidi Shirer

## 2018-05-17 NOTE — Op Note (Addendum)
Operative Note  05/17/2018  9:43 AM  PATIENT:  Carla Velez  42 y.o. female  PRE-OPERATIVE DIAGNOSIS:  Menorrhagia with anemia, submucous myoma  POST-OPERATIVE DIAGNOSIS:  Menorrhagia with anemia, submucous myoma  PROCEDURE:  Procedure(s): DILATATION & CURETTAGE/HYSTEROSCOPY WITH MYOSURE ATTEMPTED NOVASURE ENDOMETRIAL ABLATION  SURGEON:  Surgeon(s): [name], MD  ANESTHESIA:   general  FINDINGS: Intrauterine cavity with a partially Submucosal anterior right Myoma 4 cm.  DESCRIPTION OF OPERATION: Under general anesthesia with laryngeal mask the patient is in lithotomy position.  She is prepped with Betadine on the suprapubic, vulvar and vaginal areas.  The bladder is catheterized.  The patient is draped as usual.  The vaginal exam reveals an anteverted uterus with no adnexal mass.  The speculum is inserted in the vagina and the anterior lip of the cervix is grasped with a tenaculum.  The a paracervical block is done with Nesacaine 1% a total of 20 cc at 4 and 8:00.  The hysterometry is at 9 cm with a cervical length of 5 cm.  For a cavity length of 4 cm.  Dilation of the cervix with Pratt dilators up to #33 without difficulty.  The hysteroscope is inserted in the interim uterine cavity.  Pictures are taken showing both ostia and an anterior right partially submucosal myoma measuring about 4 cm in diameter.  The XL MyoSure is inserted through the hysteroscope and the submucosal  portion of the  myoma is excised.  The hysteroscope with MyoSure are removed.  We then proceeded with a systematic curettage of the intrauterine cavity on all surfaces with a sharp curette.  All specimens are sent together to pathology.  We then inserted the NovaSure device in the intrauterine cavity.  The width of the uterus is measured at 4.5 cm.  The security test is not passed.  Given that we had no evidence of perforation, it is assumed that the cavity might be too large given the excision of the large  submucosal myoma that was just done.  Decision to stop the procedure at that point.  All instruments are removed.  Hemostasis is adequate.  The speculum is removed as well.  The patient is brought to the recovery room in good and stable status.  ESTIMATED BLOOD LOSS: 10 mL FLUID DEFICIT: 500 mL  Intake/Output Summary (Last 24 hours) at 05/17/2018 0943 Last data filed at 05/17/2018 6301 Gross per 24 hour  Intake 1000 ml  Output 60 ml  Net 940 ml     BLOOD ADMINISTERED:none   LOCAL MEDICATIONS USED:  Nesacaine 1% 20 cc   SPECIMEN:  Source of Specimen:  Excision of Submucosal Myoma and Endometrial Curettings  DISPOSITION OF SPECIMEN:  PATHOLOGY  COUNTS:  YES  PLAN OF CARE: Transfer to PACU  Marie-Lyne LavoieMD9:43 AM

## 2018-05-18 ENCOUNTER — Encounter (HOSPITAL_BASED_OUTPATIENT_CLINIC_OR_DEPARTMENT_OTHER): Payer: Self-pay | Admitting: Obstetrics & Gynecology

## 2018-05-19 ENCOUNTER — Encounter: Payer: Self-pay | Admitting: *Deleted

## 2018-05-27 ENCOUNTER — Telehealth: Payer: Self-pay | Admitting: *Deleted

## 2018-05-27 MED ORDER — IBUPROFEN 800 MG PO TABS: 800.0000 mg | ORAL_TABLET | Freq: Three times a day (TID) | ORAL | 0 refills | Status: DC | PRN

## 2018-05-27 NOTE — Telephone Encounter (Signed)
Can start with Ibuprofen 800 mg every 8 hours until f/u on 7/9th.  If not enough, although we were hoping not to use Progesterone type of treatment long term because of her h/o Breast Ca, given that she had a bilateral Mastectomy and the treatment will be short term, I recommend Aygestin 5 mg/tab 2 tab daily until postop on 7/9th.  At that visit, will discuss best approach for long term treatment.

## 2018-05-27 NOTE — Telephone Encounter (Signed)
Pt informed, would like to have Rx for ibuprofen 800 mg,. Rx sent.

## 2018-05-27 NOTE — Telephone Encounter (Signed)
Patient called had D&C on 05/17/18, states she went to [facility] ER yesterday due to heavy bleeding. Her hemoglobin 11.2 at ER and pelvic exam normal per MD per patient was normal. Yesterday passed nickel size clots, no pain, lower back discomfort, tingling in both legs constantly (not painful, just uncomfortable) no urinary symptoms, yesterday and today she has been changing a pad every 1hour. She has post-op scheduled on 05/31/18. Please advise

## 2018-05-31 ENCOUNTER — Ambulatory Visit (INDEPENDENT_AMBULATORY_CARE_PROVIDER_SITE_OTHER): Payer: BLUE CROSS/BLUE SHIELD | Admitting: Obstetrics & Gynecology

## 2018-05-31 ENCOUNTER — Encounter: Payer: Self-pay | Admitting: Obstetrics & Gynecology

## 2018-05-31 VITALS — BP 118/80 | Temp 98.5°F

## 2018-05-31 DIAGNOSIS — Z170 Estrogen receptor positive status [ER+]: Secondary | ICD-10-CM | POA: Diagnosis not present

## 2018-05-31 DIAGNOSIS — N920 Excessive and frequent menstruation with regular cycle: Secondary | ICD-10-CM | POA: Diagnosis not present

## 2018-05-31 DIAGNOSIS — D250 Submucous leiomyoma of uterus: Secondary | ICD-10-CM

## 2018-05-31 DIAGNOSIS — Z09 Encounter for follow-up examination after completed treatment for conditions other than malignant neoplasm: Secondary | ICD-10-CM

## 2018-05-31 DIAGNOSIS — C50211 Malignant neoplasm of upper-inner quadrant of right female breast: Secondary | ICD-10-CM

## 2018-05-31 NOTE — Patient Instructions (Signed)
1. Follow-up examination after gynecological surgery Healing well post hysteroscopy with resection of submucosal fibroid and D&C.  Normal gynecologic exam today.  Heavy first menstrual period post resection of SM fibroid.  Pathology benign, discussed with patient.  2. Malignant neoplasm of upper-inner quadrant of right breast in female, estrogen receptor positive ([place]) Right breast cancer with estrogen and progesterone receptor positive.  Status post bilateral mastectomy, in the process of breast reconstruction.  Visit with Oncologist tomorrow.  Will discuss the possibility of the progestin only pill or the Mirena IUD with her oncologist if continues to have heavy periods.  3. Menorrhagia with regular cycle Menorrhagia associated with a partially submucosal fibroid.  Resection of the submucosal part of the fibroid on May 17, 2018.  4. Submucous uterine fibroid Submucosal part of a large fibroid resected.  Had an episode of heavy vaginal bleeding, possibly a menstrual period or bleeding from the healing resection site.  Will observe a few menstrual periods and patient will follow-up to discuss further management if continues to experience menorrhagia.  Precious Bard, good seeing you today!

## 2018-05-31 NOTE — Progress Notes (Signed)
     Carla Velez 06/27/1976 [id number]        42 y.o.  G2P0112   RP: Postop [place] Myosure resection of Fibroid/D+C on 05/17/2018  HPI: Spotting/cramping since surgery.  Had heavy vaginal bleeding 05/26/2018, then moderate and today minimal spotting.  Lower back pain and tingling towards lower limbs which is better now that she is bleeding less.  No current pelvic pain.  Urine/BMs normal.  No fever. Breast Ca ER/PR positive s/p Bilateral Mastectomy in process of breast reconstruction.  Appointment with Oncologist tomorrow.   OB History  Gravida Para Term Preterm AB Living  2 1   1 1 2   SAB TAB Ectopic Multiple Live Births      0        # Outcome Date GA Lbr Len/2nd Weight Sex Delivery Anes PTL Lv  2 Preterm           1 AB             Past medical history,surgical history, problem list, medications, allergies, family history and social history were all reviewed and documented in the EPIC chart.   Directed ROS with pertinent positives and negatives documented in the history of present illness/assessment and plan.  Exam:  Vitals:   05/31/18 1113  BP: 118/80  Temp: 98.5 F (36.9 C)  TempSrc: Oral   General appearance:  Normal  Abdomen: Soft, not distended, non-tender  Gynecologic exam: Vulva normal.  Bimanual exam:  AV uterus, normal volume, NT.  No adnexal mass, NT bilaterally.  Patho 05/17/2018: Diagnosis 1. Uterine fibroid(s), Submucosal - BENIGN ENDOMETRIUM IN SECRETORY PHASE. - FRAGMENTS OF BENIGN SMOOTH MUSCLE, COMPATIBLE WITH CLINICALLY STATED LEIOMYOMA(S). - NEGATIVE FOR HYPERPLASIA OR MALIGNANCY. 2. Endometrium, curettage - BENIGN ENDOMETRIUM IN SECRETORY PHASE. - BENIGN ECTOCERVICAL SQUAMOUS MUCOSA. - NEGATIVE FOR HYPERPLASIA OR MALIGNANCY.   Assessment/Plan:  41 y.o. [id number]   1. Follow-up examination after gynecological surgery Healing well post hysteroscopy with resection of submucosal fibroid and D&C.  Normal gynecologic exam today.  Heavy first  menstrual period post resection of SM fibroid.  Pathology benign, discussed with patient.  2. Malignant neoplasm of upper-inner quadrant of right breast in female, estrogen receptor positive ([place]) Right breast cancer with estrogen and progesterone receptor positive.  Status post bilateral mastectomy, in the process of breast reconstruction.  Visit with Oncologist tomorrow.  Will discuss the possibility of the progestin only pill or the Mirena IUD with her oncologist if continues to have heavy periods.  3. Menorrhagia with regular cycle Menorrhagia associated with a partially submucosal fibroid.  Resection of the submucosal part of the fibroid on May 17, 2018.  4. Submucous uterine fibroid Submucosal part of a large fibroid resected.  Had an episode of heavy vaginal bleeding, possibly a menstrual period or bleeding from the healing resection site.  Will observe a few menstrual periods and patient will follow-up to discuss further management if continues to experience menorrhagia.  Counseling on above issues and coordination of care more than 50% for 15 minutes.  [name] MD, 11:31 AM 05/31/2018

## 2018-08-10 IMAGING — MG MM CLIP PLACEMENT
2 series · 2 of 2 positions shown · non-contrast
Comparison: Previous exam(s).

CLINICAL DATA: Status post ultrasound-guided core biopsy of mass in
the 1:30 o'clock location of the right breast.

EXAM:
DIAGNOSTIC RIGHT MAMMOGRAM POST ULTRASOUND BIOPSY

[R ML]
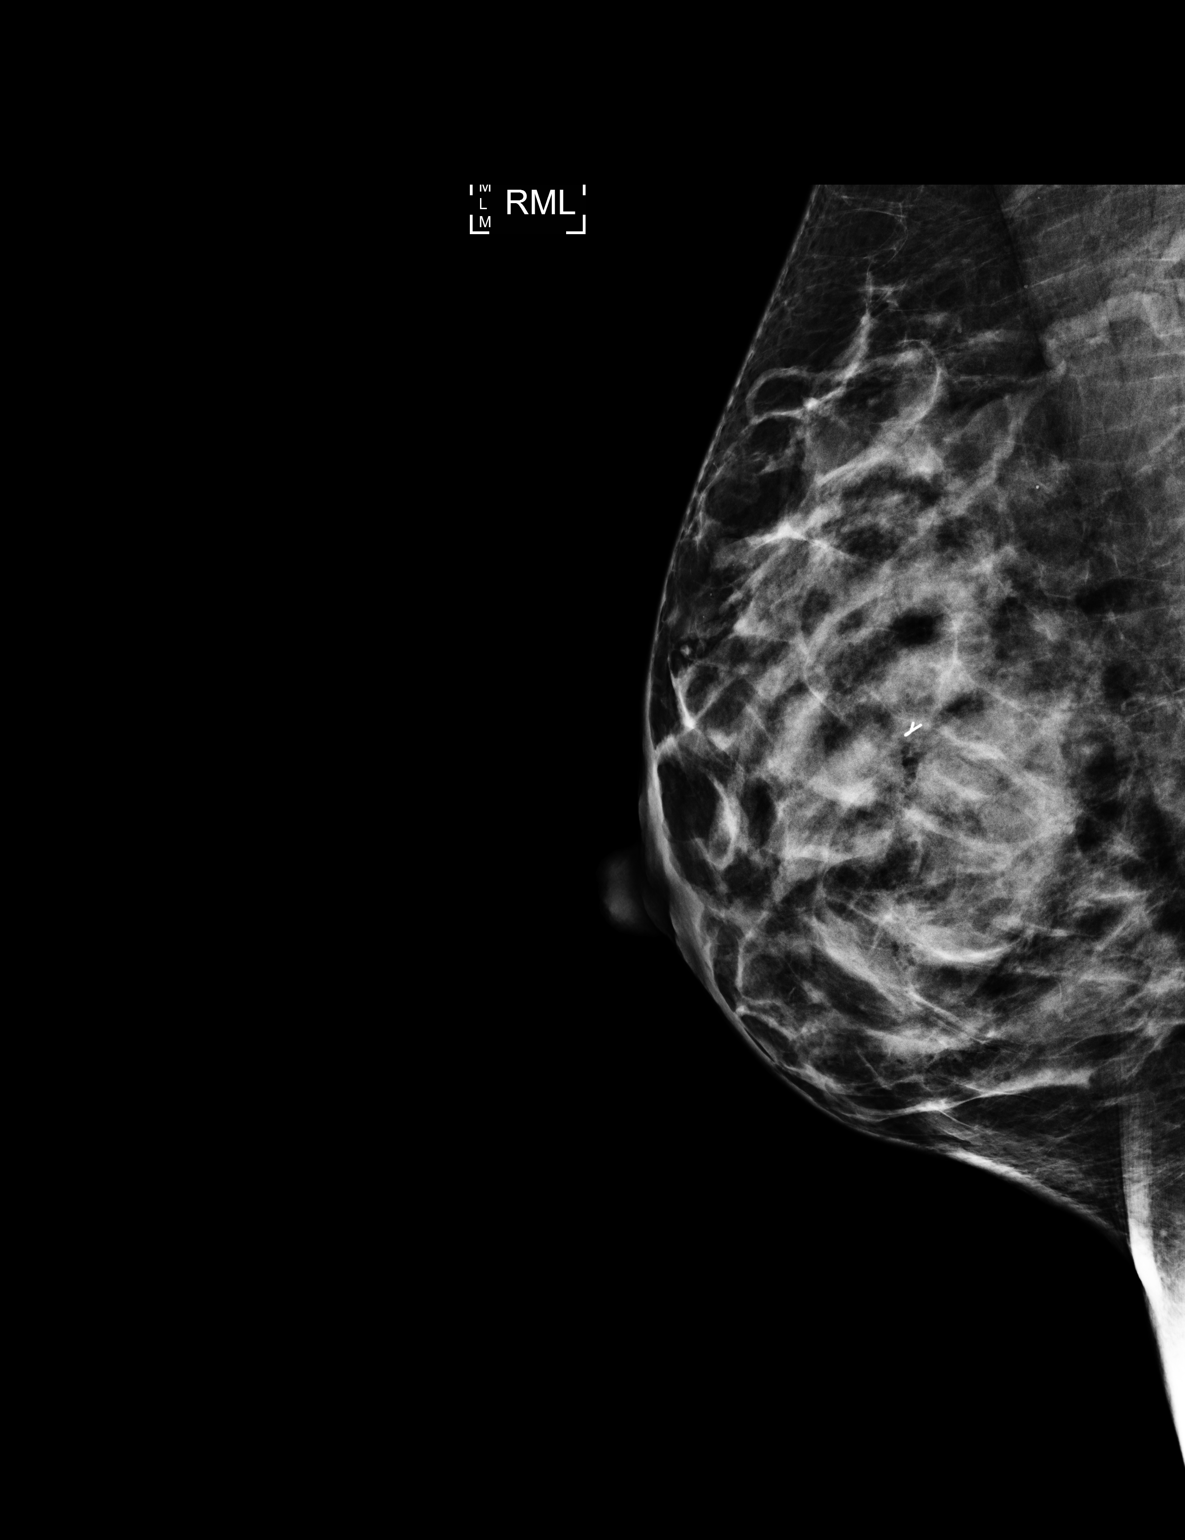

[R CC]
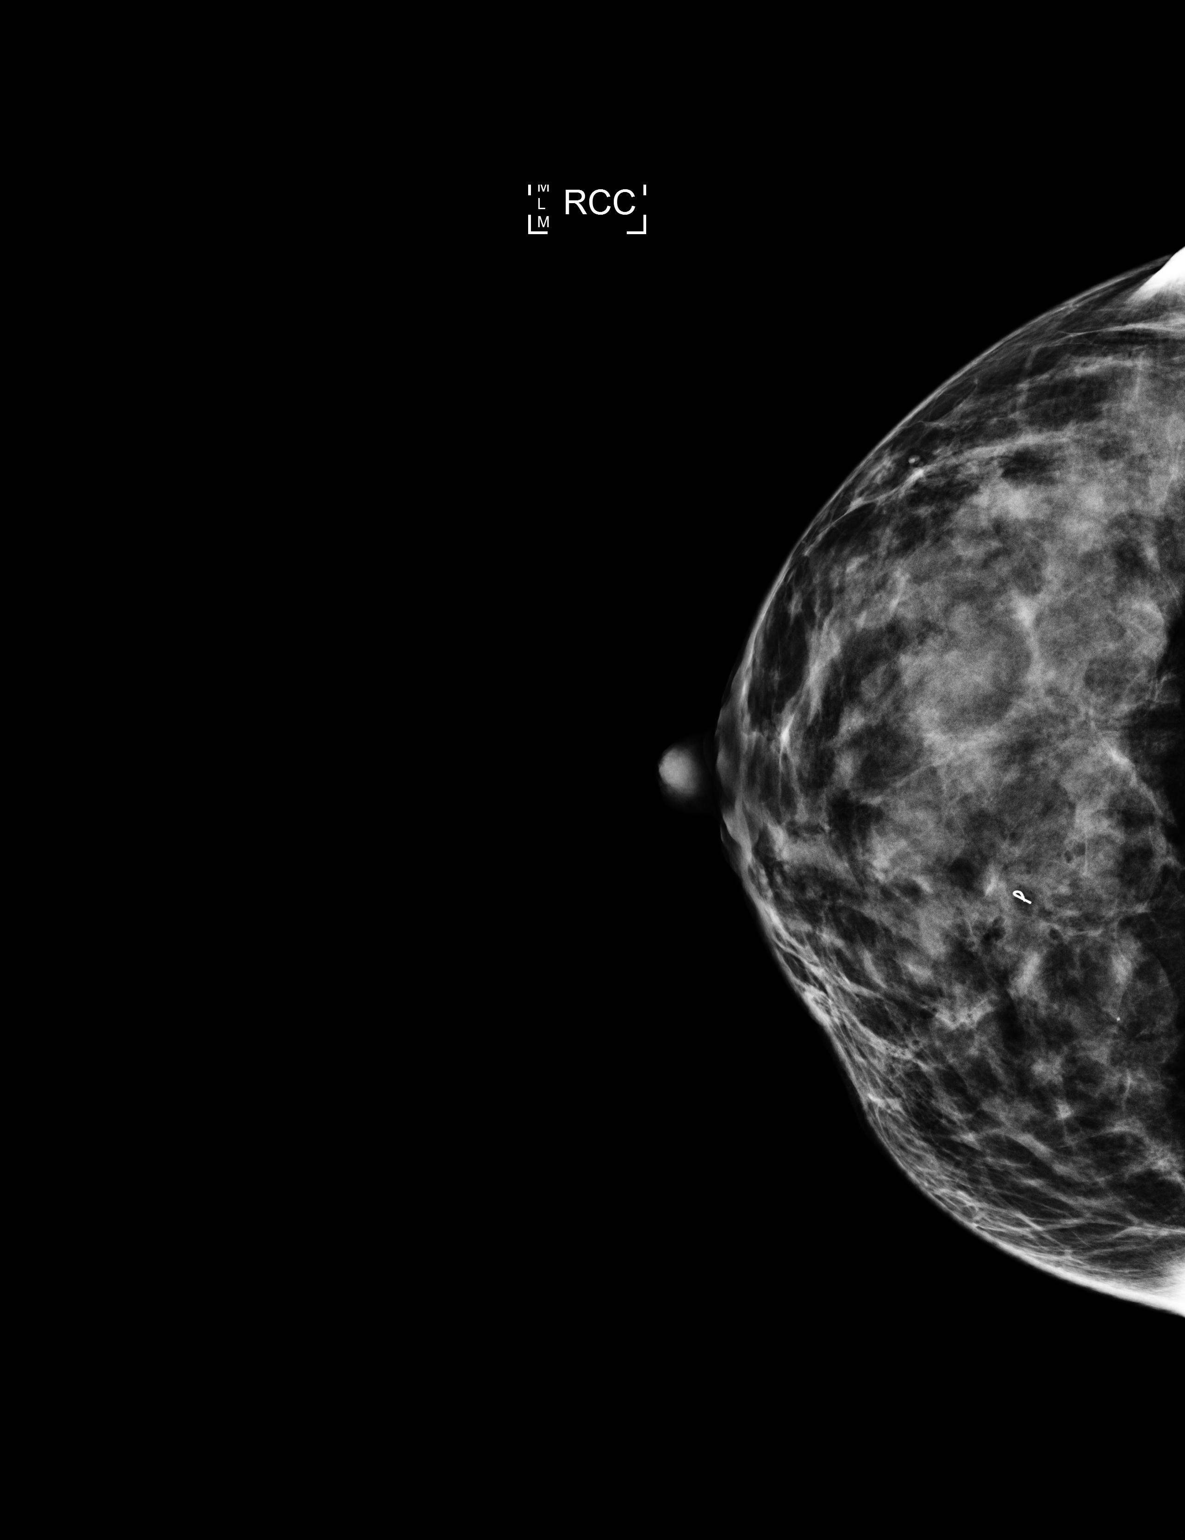

[2 of 2 positions shown; findings below may reference images not displayed]

FINDINGS: Mammographic images were obtained following ultrasound guided biopsy
of mass the 1:30 o'clock location of the right breast. A ribbon
shaped clip is identified in the upper inner quadrant of the right
breast as expected.
IMPRESSION: Tissue marker clip in the expected location following biopsy.

Final Assessment: Post Procedure Mammograms for Marker Placement

## 2018-08-24 ENCOUNTER — Encounter: Payer: Self-pay | Admitting: Obstetrics & Gynecology

## 2018-08-24 ENCOUNTER — Ambulatory Visit (INDEPENDENT_AMBULATORY_CARE_PROVIDER_SITE_OTHER): Payer: BLUE CROSS/BLUE SHIELD | Admitting: Obstetrics & Gynecology

## 2018-08-24 VITALS — BP 112/74

## 2018-08-24 DIAGNOSIS — Z170 Estrogen receptor positive status [ER+]: Secondary | ICD-10-CM

## 2018-08-24 DIAGNOSIS — D219 Benign neoplasm of connective and other soft tissue, unspecified: Secondary | ICD-10-CM

## 2018-08-24 DIAGNOSIS — N920 Excessive and frequent menstruation with regular cycle: Secondary | ICD-10-CM | POA: Diagnosis not present

## 2018-08-24 DIAGNOSIS — C50211 Malignant neoplasm of upper-inner quadrant of right female breast: Secondary | ICD-10-CM

## 2018-08-24 NOTE — Patient Instructions (Signed)
1. Menorrhagia with regular cycle Persistent menorrhagia post hysteroscopy with resection of a submucosal myoma.  Secondary anemia.  Contraindication to hormonal management of her heavy periods given a diagnosis of right breast cancer with estrogen and progesterone receptors positive.  Patient will follow-up tomorrow with a repeat ultrasound to evaluate her intrauterine cavity and fibroids.  If possible we will proceed with a NovaSure endometrial ablation.  If still presenting a significant submucosal myoma, hysterectomy would probably be a better option to avoid recurrence of heavy periods. - US Transvaginal Non-OB; Future  2. Fibroid As above. - US Transvaginal Non-OB; Future  3. Malignant neoplasm of upper-inner quadrant of right breast in female, estrogen receptor positive ([place])  Patti, good to see you today!

## 2018-08-24 NOTE — Progress Notes (Signed)
    Carla Velez [birth year] [id number]        42 y.o.  G2P0112   RP: Menorrhagia  HPI: Patient had a heavy menses for which she was investigated in June 2019.  Pelvic ultrasound and sono hysterogram showed an intramural fibroid and a submucosal fibroid for which a hysteroscopy with MyoSure resection of the submucosal fibroid was done as well as a D&C on May 17, 2018.  The pathology showed benign endometrium and secretory phase with leiomyoma and no hyperplasia or malignancy.  Since the procedure, she has had 3 menstrual periods monthly with continued very heavy flow.  In spite of taking her iron supplements her hemoglobin was 11.3 on August 22, 2018.  An IV iron injection is planned with her hematologist.  Patient has a diagnosis of right breast cancer with estrogen and progesterone receptors positive.  She therefore cannot be on hormonal treatment to control her heavy periods.   OB History  Gravida Para Term Preterm AB Living  2 1   1 1 2   SAB TAB Ectopic Multiple Live Births      0        # Outcome Date GA Lbr Len/2nd Weight Sex Delivery Anes PTL Lv  2 Preterm           1 AB             Past medical history,surgical history, problem list, medications, allergies, family history and social history were all reviewed and documented in the EPIC chart.   Directed ROS with pertinent positives and negatives documented in the history of present illness/assessment and plan.  Exam:  Vitals:   08/24/18 1414  BP: 112/74   General appearance:  Normal  Abdomen: Normal  Gynecologic exam: Vulva normal.  Speculum: Cervix and vagina normal.  Bimanual exam: Uterus anteverted just mildly increased in volume, mobile, nontender.  No adnexal mass felt, nontender bilaterally.   Assessment/Plan:  42 y.o. [id number]   1. Menorrhagia with regular cycle Persistent menorrhagia post hysteroscopy with resection of a submucosal myoma.  Secondary anemia.  Contraindication to hormonal management of  her heavy periods given a diagnosis of right breast cancer with estrogen and progesterone receptors positive.  Patient will follow-up tomorrow with a repeat ultrasound to evaluate her intrauterine cavity and fibroids.  If possible we will proceed with a NovaSure endometrial ablation.  If still presenting a significant submucosal myoma, hysterectomy would probably be a better option to avoid recurrence of heavy periods. - US Transvaginal Non-OB; Future  2. Fibroid As above. - US Transvaginal Non-OB; Future  3. Malignant neoplasm of upper-inner quadrant of right breast in female, estrogen receptor positive ([place])  Counseling on above issues and coordination of care more than 50% for 25 minutes.  [name] MD, 2:37 PM 08/24/2018

## 2018-08-25 ENCOUNTER — Other Ambulatory Visit: Payer: Self-pay | Admitting: Obstetrics & Gynecology

## 2018-08-25 ENCOUNTER — Ambulatory Visit (INDEPENDENT_AMBULATORY_CARE_PROVIDER_SITE_OTHER): Payer: BLUE CROSS/BLUE SHIELD

## 2018-08-25 ENCOUNTER — Ambulatory Visit (INDEPENDENT_AMBULATORY_CARE_PROVIDER_SITE_OTHER): Payer: BLUE CROSS/BLUE SHIELD | Admitting: Obstetrics & Gynecology

## 2018-08-25 DIAGNOSIS — D251 Intramural leiomyoma of uterus: Secondary | ICD-10-CM | POA: Diagnosis not present

## 2018-08-25 DIAGNOSIS — D219 Benign neoplasm of connective and other soft tissue, unspecified: Secondary | ICD-10-CM

## 2018-08-25 DIAGNOSIS — N920 Excessive and frequent menstruation with regular cycle: Secondary | ICD-10-CM | POA: Diagnosis not present

## 2018-08-25 DIAGNOSIS — Z170 Estrogen receptor positive status [ER+]: Secondary | ICD-10-CM | POA: Diagnosis not present

## 2018-08-25 DIAGNOSIS — D250 Submucous leiomyoma of uterus: Secondary | ICD-10-CM

## 2018-08-25 DIAGNOSIS — C50211 Malignant neoplasm of upper-inner quadrant of right female breast: Secondary | ICD-10-CM | POA: Diagnosis not present

## 2018-08-25 NOTE — Progress Notes (Signed)
    Carla Velez July 21, 1976 [id number]        42 y.o.  G2P0112   RP: Menorrhagia persisting post resection of a SM Myoma  HPI: Heavy menses every month x 3 since [facility] Resection of SM Myoma, D+C in 04/2018.  Breast Ca with ER/PR positive, therefore Hormonal therapy to control the menstrual cycle is CI.   OB History  Gravida Para Term Preterm AB Living  2 1   1 1 2   SAB TAB Ectopic Multiple Live Births      0        # Outcome Date GA Lbr Len/2nd Weight Sex Delivery Anes PTL Lv  2 Preterm           1 AB             Past medical history,surgical history, problem list, medications, allergies, family history and social history were all reviewed and documented in the EPIC chart.   Directed ROS with pertinent positives and negatives documented in the history of present illness/assessment and plan.  Exam:  There were no vitals filed for this visit. General appearance:  Normal  Pelvic US today: T/V images.  Uterus anteverted measuring 9.47 x 7.34 x 5.33 cm.  Intramural fibroid measuring 2.7 x 1.5 x 2.6 cm.  Submucosal fibroid measured at 3.6 x 2.6 x 3.2 cm.  Endometrial lining measured at 27.5 mm, including the submucosal myoma.  Right ovary normal.  Left ovary normal.  No free fluid in the posterior cul-de-sac.   Assessment/Plan:  42 y.o. [id number]   1. Menorrhagia with regular cycle Persistent menorrhagia after partial resection of an intramural/submucosal myoma.  Contraindication to hormone therapy to manage to heavy menstrual periods given estrogen and progesterone receptor positive breast cancer.  Decision to proceed with a sonohysterogram to evaluate if the myoma is completely submucosal or still partially intramural.  Per findings, will decide if proceeding with hysteroscopy ache excision of the myoma, would have this option if the myoma is completely submucosal.  But given the size of the myoma, always the risk of not being able to excise the myoma completely by hysteroscopy and  also risks of continued menometrorrhagia from other fibroids.  Patient has no desire to preserve fertility.  Recommend hysterectomy.  Total hysterectomy and supracervical hysterectomy with bilateral salpingectomy discussed.  Bilateral oophorectomy also discussed given the diagnosis of estrogen/progesterone receptor positive breast cancer.  Patient has declined tamoxifen therapy after counseling by oncologist. - [place] Sonohysterogram; Future  2. Fibroids Ultrasound findings discussed with patient.  Intramural fibroid 2.7 x 2.6 cm and submucosal fibroid measuring 3.6 x 2.6 x 3.2 cm. - [place] Sonohysterogram; Future  3. Malignant neoplasm of upper-inner quadrant of right breast in female, estrogen receptor positive ([place]) Followed by oncologist.  Tamoxifen declined by patient.  Counseling on above issues and coordination of care more than 50% for 15 minutes.  Carla Bruins MD, 11:04 AM 08/25/2018

## 2018-08-28 ENCOUNTER — Encounter: Payer: Self-pay | Admitting: Obstetrics & Gynecology

## 2018-08-28 NOTE — Patient Instructions (Signed)
1. Menorrhagia with regular cycle Persistent menorrhagia after partial resection of an intramural/submucosal myoma.  Contraindication to hormone therapy to manage to heavy menstrual periods given estrogen and progesterone receptor positive breast cancer.  Decision to proceed with a sonohysterogram to evaluate if the myoma is completely submucosal or still partially intramural.  Per findings, will decide if proceeding with hysteroscopy ache excision of the myoma, would have this option if the myoma is completely submucosal.  But given the size of the myoma, always the risk of not being able to excise the myoma completely by hysteroscopy and also risks of continued menometrorrhagia from other fibroids.  Patient has no desire to preserve fertility.  Recommend hysterectomy.  Total hysterectomy and supracervical hysterectomy with bilateral salpingectomy discussed.  Bilateral oophorectomy also discussed given the diagnosis of estrogen/progesterone receptor positive breast cancer.  Patient has declined tamoxifen therapy after counseling by oncologist. - [place] Sonohysterogram; Future  2. Fibroids Ultrasound findings discussed with patient.  Intramural fibroid 2.7 x 2.6 cm and submucosal fibroid measuring 3.6 x 2.6 x 3.2 cm. - [place] Sonohysterogram; Future  3. Malignant neoplasm of upper-inner quadrant of right breast in female, estrogen receptor positive ([place]) Followed by oncologist.  Tamoxifen declined by patient.  Precious Bard, good seeing you today!

## 2018-09-21 ENCOUNTER — Ambulatory Visit (INDEPENDENT_AMBULATORY_CARE_PROVIDER_SITE_OTHER): Payer: BLUE CROSS/BLUE SHIELD

## 2018-09-21 ENCOUNTER — Ambulatory Visit (INDEPENDENT_AMBULATORY_CARE_PROVIDER_SITE_OTHER): Payer: BLUE CROSS/BLUE SHIELD | Admitting: Obstetrics & Gynecology

## 2018-09-21 DIAGNOSIS — N920 Excessive and frequent menstruation with regular cycle: Secondary | ICD-10-CM | POA: Diagnosis not present

## 2018-09-21 DIAGNOSIS — D500 Iron deficiency anemia secondary to blood loss (chronic): Secondary | ICD-10-CM | POA: Diagnosis not present

## 2018-09-21 DIAGNOSIS — D219 Benign neoplasm of connective and other soft tissue, unspecified: Secondary | ICD-10-CM

## 2018-09-21 HISTORY — PX: HYSTEROSCOPY: SHX211

## 2018-09-21 NOTE — Progress Notes (Signed)
Carla Velez 1976/05/30 [id number]        42 y.o.  G2P0112   RP: Menorrhagia with probable submucosal myoma for Sonohysterogram  HPI: Had partial resection of a large IM/Submucosal Myoma by [facility] and unsuccessful Novasure Endometrial ablation on 05/17/2018.  Patho benign.  Heavy bleeding since then with anemia.  Received IV Iron early 08/2018.  H/O Rt Breast Ca ER/PR positive, therefore cannot control the menorrhagia with hormonal treatment.     OB History  Gravida Para Term Preterm AB Living  2 1   1 1 2   SAB TAB Ectopic Multiple Live Births      0        # Outcome Date GA Lbr Len/2nd Weight Sex Delivery Anes PTL Lv  2 Preterm           1 AB             Past medical history,surgical history, problem list, medications, allergies, family history and social history were all reviewed and documented in the EPIC chart.   Directed ROS with pertinent positives and negatives documented in the history of present illness/assessment and plan.  Exam:  There were no vitals filed for this visit. General appearance:  Normal                                                                    Sono Infusion Hysterogram ( procedure note)   The initial transvaginal ultrasound demonstrated the following:  See pelvic ultrasound findings on August 25, 2018.   The speculum  was inserted and the cervix cleansed with Betadine solution after confirming that patient has no allergies.A small sonohysterography catheterwas utilized.  Insertion was facilitated with ring forceps, using a spear-like motion the catheter was inserted to the fundus of the uterus. The speculum is then removed carefully to avoid dislodging the catheter. The catheter was flushed with sterile saline delete prior to insertion to rid it of small amounts of air.the sterile saline solution was infused into the uterine cavity as a vaginal ultrasound probe was then placed in the vagina for full visualization of the uterine cavity from  a transvaginal approach. The following was noted:  Following injection of saline the endometrial cavity is filled with an anterior intramural/submucosal fibroid measuring 3.9 x 3.7 x 3.1 cm is seen, about 30% of the fibroid is submucosal.  A smaller intramural posterior fibroid is present.  The catheter was then removed after retrieving some of the saline from the intrauterine cavity. An endometrial biopsy was not done. Patient tolerated procedure well. She had received a tablet of Aleve for discomfort.    Assessment/Plan:  42 y.o. [id number]   1. Menorrhagia with regular cycle Menorrhagia with thickened diarrhea and anemia persisting after partial resection of the submucosal/intramural fibroid.  Unsuccessful endometrial ablation.  Sonohysterogram today revealing a 3.9 x 3.7 x 3.1 cm fibroid with a 30% submucosal portion.  A smaller intramural posterior fibroid is also present.  Decision to proceed with hysterectomy.  After discussing the different approaches, patient opted for an LAVH/Bilateral Salpingectomy.  Surgery, risks and benefits thoroughly reviewed with patient.  Pamphlet given.  Follow-up preop visit. - [place] Sonohysterogram  2. Fibroids As above. - [place] Sonohysterogram  3. Iron deficiency anemia due to  chronic blood loss IV iron received through hematologist early October 2019.  Counseling on above issues and coordination of care more than 50% for 15 minutes.  [name] MD, 4:27 PM 09/21/2018

## 2018-09-22 ENCOUNTER — Telehealth: Payer: Self-pay

## 2018-09-22 NOTE — Telephone Encounter (Signed)
I called and spoke with patient regarding scheduling LAVH, Bilat Salpingectomy.  Reviewed that she has met ded and OOP max and claims being paid 100%. No surgery prepymt due.  Scheduled her for 10/31/18 at 10:30am at [facility].  I will mail her a pamphlet from the [facility]

## 2018-09-24 ENCOUNTER — Encounter: Payer: Self-pay | Admitting: Obstetrics & Gynecology

## 2018-09-24 NOTE — Patient Instructions (Signed)
1. Menorrhagia with regular cycle Menorrhagia with thickened diarrhea and anemia persisting after partial resection of the submucosal/intramural fibroid.  Unsuccessful endometrial ablation.  Sonohysterogram today revealing a 3.9 x 3.7 x 3.1 cm fibroid with a 30% submucosal portion.  A smaller intramural posterior fibroid is also present.  Decision to proceed with hysterectomy.  After discussing the different approaches, patient opted for an LAVH/Bilateral Salpingectomy.  Surgery, risks and benefits thoroughly reviewed with patient.  Pamphlet given.  Follow-up preop visit. - [place] Sonohysterogram  2. Fibroids As above. - [place] Sonohysterogram  3. Iron deficiency anemia due to chronic blood loss IV iron received through hematologist early October 2019.  Precious Bard, good seeing you today!

## 2018-10-10 ENCOUNTER — Encounter: Payer: Self-pay | Admitting: Anesthesiology

## 2018-10-25 ENCOUNTER — Encounter (HOSPITAL_COMMUNITY): Payer: Self-pay

## 2018-10-25 ENCOUNTER — Other Ambulatory Visit: Payer: Self-pay

## 2018-10-25 ENCOUNTER — Encounter (HOSPITAL_COMMUNITY)
Admission: RE | Admit: 2018-10-25 | Discharge: 2018-10-25 | Disposition: A | Discharge status: Home / Self Care | Payer: BLUE CROSS/BLUE SHIELD | Source: Ambulatory Visit | Attending: Obstetrics & Gynecology | Admitting: Obstetrics & Gynecology

## 2018-10-25 DIAGNOSIS — Z01812 Encounter for preprocedural laboratory examination: Secondary | ICD-10-CM | POA: Insufficient documentation

## 2018-10-25 LAB — BASIC METABOLIC PANEL
ANION GAP: 6 (ref 5–15)
BUN: 11 mg/dL (ref 6–20)
CO2: 28 mmol/L (ref 22–32)
Calcium: 8.8 mg/dL — ABNORMAL LOW (ref 8.9–10.3)
Chloride: 107 mmol/L (ref 98–111)
Creatinine, Ser: 0.71 mg/dL (ref 0.44–1.00)
GFR calc non Af Amer: >60 mL/min (ref >60)
Glucose, Bld: 75 mg/dL (ref 70–99)
Potassium: 4.2 mmol/L (ref 3.5–5.1)
SODIUM: 141 mmol/L (ref 135–145)

## 2018-10-25 LAB — CBC
HCT: 39.4 % (ref 36.0–46.0)
Hemoglobin: 12.3 g/dL (ref 12.0–15.0)
MCH: 28.9 pg (ref 26.0–34.0)
MCHC: 31.2 g/dL (ref 30.0–36.0)
MCV: 92.5 fL (ref 80.0–100.0)
NRBC: 0 % (ref 0.0–0.2)
Platelets: 173 10*3/uL (ref 150–400)
RBC: 4.26 MIL/uL (ref 3.87–5.11)
RDW: 17 % — AB (ref 11.5–15.5)
WBC: 5.7 10*3/uL (ref 4.0–10.5)

## 2018-10-25 LAB — ABO/RH: ABO/RH(D): B POS

## 2018-10-25 NOTE — Patient Instructions (Signed)
Carla Velez  10/25/2018      Your procedure is scheduled on   10-31-18   Report to [place]  at  8:15A.M.    Call this number if you have problems the morning of surgery:[phone]    OUR ADDRESS IS [place], WE ARE LOCATED IN THE [facility] WITH [facility].   Remember:  Do not eat food or drink liquids after midnight.  Take these medicines the morning of surgery with A SIP OF WATER: NONE   Do not wear jewelry, make-up or nail polish.  Do not wear lotions, powders, or perfumes, or deoderant.  Do not shave 48 hours prior to surgery.  Men may shave face and neck.  Do not bring valuables to the hospital.  [facility] is not responsible for any belongings or valuables.  Contacts, dentures or bridgework may not be worn into surgery.  Leave your suitcase in the car.  After surgery it may be brought to your room.  For patients admitted to the hospital, discharge time will be determined by your treatment team.  Patients discharged the day of surgery will not be allowed to drive home.   Special instructions:  n/a  Please read over the following fact sheets that you were given:       [facility] - Preparing for Surgery Before surgery, you can play an important role.  Because skin is not sterile, your skin needs to be as free of germs as possible.  You can reduce the number of germs on your skin by washing with CHG (chlorahexidine gluconate) soap before surgery.  CHG is an antiseptic cleaner which kills germs and bonds with the skin to continue killing germs even after washing. Please DO NOT use if you have an allergy to CHG or antibacterial soaps.  If your skin becomes reddened/irritated stop using the CHG and inform your nurse when you arrive at Short Stay. Do not shave (including legs and underarms) for at least 48 hours prior to the first CHG shower.  You may shave your face/neck. Please follow these instructions carefully:  1.   Shower with CHG Soap the night before surgery and the  morning of Surgery.  2.  If you choose to wash your hair, wash your hair first as usual with your  normal  shampoo.  3.  After you shampoo, rinse your hair and body thoroughly to remove the  shampoo.                           4.  Use CHG as you would any other liquid soap.  You can apply chg directly  to the skin and wash                       Gently with a scrungie or clean washcloth.  5.  Apply the CHG Soap to your body ONLY FROM THE NECK DOWN.   Do not use on face/ open                           Wound or open sores. Avoid contact with eyes, ears mouth and genitals (private parts).                       Wash face,  Genitals (private parts) with your normal soap.  6.  Wash thoroughly, paying special attention to the area where your surgery  will be performed.  7.  Thoroughly rinse your body with warm water from the neck down.  8.  DO NOT shower/wash with your normal soap after using and rinsing off  the CHG Soap.                9.  Pat yourself dry with a clean towel.            10.  Wear clean pajamas.            11.  Place clean sheets on your bed the night of your first shower and do not  sleep with pets. Day of Surgery : Do not apply any lotions/deodorants the morning of surgery.  Please wear clean clothes to the hospital/surgery center.  FAILURE TO FOLLOW THESE INSTRUCTIONS MAY RESULT IN THE CANCELLATION OF YOUR SURGERY PATIENT SIGNATURE_________________________________  NURSE SIGNATURE__________________________________  ________________________________________________________________________   Adam Phenix  An incentive spirometer is a tool that can help keep your lungs clear and active. This tool measures how well you are filling your lungs with each breath. Taking long deep breaths may help reverse or decrease the chance of developing breathing (pulmonary) problems (especially infection) following:  A long  period of time when you are unable to move or be active. BEFORE THE PROCEDURE   If the spirometer includes an indicator to show your best effort, your nurse or respiratory therapist will set it to a desired goal.  If possible, sit up straight or lean slightly forward. Try not to slouch.  Hold the incentive spirometer in an upright position. INSTRUCTIONS FOR USE  1. Sit on the edge of your bed if possible, or sit up as far as you can in bed or on a chair. 2. Hold the incentive spirometer in an upright position. 3. Breathe out normally. 4. Place the mouthpiece in your mouth and seal your lips tightly around it. 5. Breathe in slowly and as deeply as possible, raising the piston or the ball toward the top of the column. 6. Hold your breath for 3-5 seconds or for as long as possible. Allow the piston or ball to fall to the bottom of the column. 7. Remove the mouthpiece from your mouth and breathe out normally. 8. Rest for a few seconds and repeat Steps 1 through 7 at least 10 times every 1-2 hours when you are awake. Take your time and take a few normal breaths between deep breaths. 9. The spirometer may include an indicator to show your best effort. Use the indicator as a goal to work toward during each repetition. 10. After each set of 10 deep breaths, practice coughing to be sure your lungs are clear. If you have an incision (the cut made at the time of surgery), support your incision when coughing by placing a pillow or rolled up towels firmly against it. Once you are able to get out of bed, walk around indoors and cough well. You may stop using the incentive spirometer when instructed by your caregiver.  RISKS AND COMPLICATIONS  Take your time so you do not get dizzy or light-headed.  If you are in pain, you may need to take or ask for pain medication before doing incentive spirometry. It is harder to take a deep breath if you are having pain. AFTER USE  Rest and breathe slowly and  easily.  It can be helpful to keep track of a log of  your progress. Your caregiver can provide you with a simple table to help with this. If you are using the spirometer at home, follow these instructions: [place] IF:   You are having difficultly using the spirometer.  You have trouble using the spirometer as often as instructed.  Your pain medication is not giving enough relief while using the spirometer.  You develop fever of 100.5 F (38.1 C) or higher. SEEK IMMEDIATE MEDICAL CARE IF:   You cough up bloody sputum that had not been present before.  You develop fever of 102 F (38.9 C) or greater.  You develop worsening pain at or near the incision site. MAKE SURE YOU:   Understand these instructions.  Will watch your condition.  Will get help right away if you are not doing well or get worse. Document Released: 03/22/2007 Document Revised: 02/01/2012 Document Reviewed: 05/23/2007 [facility] Patient Information 2014 [place], [place].   ________________________________________________________________________

## 2018-10-31 ENCOUNTER — Ambulatory Visit (HOSPITAL_BASED_OUTPATIENT_CLINIC_OR_DEPARTMENT_OTHER)
Admission: RE | Admit: 2018-10-31 | Discharge: 2018-11-01 | Disposition: A | Discharge status: Home / Self Care | Payer: BLUE CROSS/BLUE SHIELD | Source: Ambulatory Visit | Attending: Obstetrics & Gynecology | Admitting: Obstetrics & Gynecology

## 2018-10-31 ENCOUNTER — Ambulatory Visit (HOSPITAL_BASED_OUTPATIENT_CLINIC_OR_DEPARTMENT_OTHER): Payer: BLUE CROSS/BLUE SHIELD | Admitting: Anesthesiology

## 2018-10-31 ENCOUNTER — Encounter (HOSPITAL_COMMUNITY)
Admission: RE | Disposition: A | Discharge status: Home / Self Care | Payer: Self-pay | Source: Ambulatory Visit | Attending: Obstetrics & Gynecology

## 2018-10-31 ENCOUNTER — Other Ambulatory Visit: Payer: Self-pay

## 2018-10-31 ENCOUNTER — Encounter (HOSPITAL_BASED_OUTPATIENT_CLINIC_OR_DEPARTMENT_OTHER): Payer: Self-pay | Admitting: Anesthesiology

## 2018-10-31 DIAGNOSIS — Z881 Allergy status to other antibiotic agents status: Secondary | ICD-10-CM | POA: Insufficient documentation

## 2018-10-31 DIAGNOSIS — N838 Other noninflammatory disorders of ovary, fallopian tube and broad ligament: Secondary | ICD-10-CM | POA: Diagnosis not present

## 2018-10-31 DIAGNOSIS — Z9889 Other specified postprocedural states: Secondary | ICD-10-CM

## 2018-10-31 DIAGNOSIS — N920 Excessive and frequent menstruation with regular cycle: Secondary | ICD-10-CM | POA: Insufficient documentation

## 2018-10-31 DIAGNOSIS — Z853 Personal history of malignant neoplasm of breast: Secondary | ICD-10-CM | POA: Diagnosis not present

## 2018-10-31 DIAGNOSIS — Z880 Allergy status to penicillin: Secondary | ICD-10-CM | POA: Insufficient documentation

## 2018-10-31 DIAGNOSIS — D500 Iron deficiency anemia secondary to blood loss (chronic): Secondary | ICD-10-CM | POA: Diagnosis not present

## 2018-10-31 DIAGNOSIS — N800 Endometriosis of uterus: Secondary | ICD-10-CM | POA: Insufficient documentation

## 2018-10-31 DIAGNOSIS — D259 Leiomyoma of uterus, unspecified: Secondary | ICD-10-CM | POA: Diagnosis not present

## 2018-10-31 DIAGNOSIS — D251 Intramural leiomyoma of uterus: Secondary | ICD-10-CM | POA: Diagnosis not present

## 2018-10-31 DIAGNOSIS — Z882 Allergy status to sulfonamides status: Secondary | ICD-10-CM | POA: Insufficient documentation

## 2018-10-31 DIAGNOSIS — N72 Inflammatory disease of cervix uteri: Secondary | ICD-10-CM | POA: Diagnosis not present

## 2018-10-31 DIAGNOSIS — K660 Peritoneal adhesions (postprocedural) (postinfection): Secondary | ICD-10-CM | POA: Insufficient documentation

## 2018-10-31 DIAGNOSIS — N803 Endometriosis of pelvic peritoneum: Secondary | ICD-10-CM | POA: Diagnosis not present

## 2018-10-31 DIAGNOSIS — N921 Excessive and frequent menstruation with irregular cycle: Secondary | ICD-10-CM | POA: Insufficient documentation

## 2018-10-31 HISTORY — PX: LAPAROSCOPIC VAGINAL HYSTERECTOMY WITH SALPINGECTOMY: SHX6680

## 2018-10-31 LAB — TYPE AND SCREEN
ABO/RH(D): B POS
ANTIBODY SCREEN: NEGATIVE

## 2018-10-31 LAB — POCT PREGNANCY, URINE: Preg Test, Ur: NEGATIVE

## 2018-10-31 SURGERY — HYSTERECTOMY, VAGINAL, LAPAROSCOPY-ASSISTED, WITH SALPINGECTOMY
Anesthesia: General | Site: Abdomen | Laterality: Bilateral

## 2018-10-31 MED ORDER — FENTANYL CITRATE (PF) 100 MCG/2ML IJ SOLN
25.0000 ug | INTRAMUSCULAR | Status: DC | PRN
  Administered 2018-10-31: 25 ug via INTRAVENOUS
  Filled 2018-10-31: qty 1

## 2018-10-31 MED ORDER — MIDAZOLAM HCL 5 MG/5ML IJ SOLN
INTRAMUSCULAR | Status: DC | PRN
  Administered 2018-10-31: 2 mg via INTRAVENOUS

## 2018-10-31 MED ORDER — OXYCODONE-ACETAMINOPHEN 5-325 MG PO TABS
2.0000 | ORAL_TABLET | ORAL | Status: DC | PRN
  Filled 2018-10-31: qty 2

## 2018-10-31 MED ORDER — GLYCOPYRROLATE PF 0.2 MG/ML IJ SOSY
PREFILLED_SYRINGE | INTRAMUSCULAR | Status: AC
  Filled 2018-10-31: qty 1

## 2018-10-31 MED ORDER — DEXAMETHASONE SODIUM PHOSPHATE 4 MG/ML IJ SOLN
INTRAMUSCULAR | Status: DC | PRN
  Administered 2018-10-31: 10 mg via INTRAVENOUS

## 2018-10-31 MED ORDER — BUPIVACAINE HCL (PF) 0.25 % IJ SOLN
INTRAMUSCULAR | Status: DC | PRN
  Administered 2018-10-31: 10 mL

## 2018-10-31 MED ORDER — SCOPOLAMINE 1 MG/3DAYS TD PT72
1.0000 | MEDICATED_PATCH | TRANSDERMAL | Status: DC
  Administered 2018-10-31: 1.5 mg via TRANSDERMAL
  Filled 2018-10-31: qty 1

## 2018-10-31 MED ORDER — LACTATED RINGERS IV SOLN
INTRAVENOUS | Status: DC
  Administered 2018-10-31 (×3): via INTRAVENOUS
  Filled 2018-10-31: qty 1000

## 2018-10-31 MED ORDER — OXYCODONE HCL 5 MG/5ML PO SOLN
5.0000 mg | Freq: Once | ORAL | Status: DC | PRN
  Filled 2018-10-31: qty 5

## 2018-10-31 MED ORDER — DEXAMETHASONE SODIUM PHOSPHATE 10 MG/ML IJ SOLN
INTRAMUSCULAR | Status: AC
  Filled 2018-10-31: qty 1

## 2018-10-31 MED ORDER — LIDOCAINE HCL (CARDIAC) PF 100 MG/5ML IV SOSY
PREFILLED_SYRINGE | INTRAVENOUS | Status: DC | PRN
  Administered 2018-10-31: 80 mg via INTRAVENOUS
  Administered 2018-10-31: 20 mg via INTRAVENOUS

## 2018-10-31 MED ORDER — SODIUM CHLORIDE 0.9 % IR SOLN
Status: DC | PRN
  Administered 2018-10-31: 3000 mL

## 2018-10-31 MED ORDER — SUGAMMADEX SODIUM 200 MG/2ML IV SOLN
INTRAVENOUS | Status: DC | PRN
  Administered 2018-10-31: 200 mg via INTRAVENOUS

## 2018-10-31 MED ORDER — PHENYLEPHRINE HCL 10 MG/ML IJ SOLN
INTRAMUSCULAR | Status: DC | PRN
  Administered 2018-10-31: 80 ug via INTRAVENOUS
  Administered 2018-10-31: 120 ug via INTRAVENOUS
  Administered 2018-10-31 (×2): 40 ug via INTRAVENOUS

## 2018-10-31 MED ORDER — ONDANSETRON HCL 4 MG/2ML IJ SOLN
INTRAMUSCULAR | Status: AC
  Filled 2018-10-31: qty 2

## 2018-10-31 MED ORDER — GENTAMICIN SULFATE 40 MG/ML IJ SOLN
INTRAVENOUS | Status: AC
  Administered 2018-10-31: 320 mg via INTRAVENOUS
  Filled 2018-10-31: qty 8

## 2018-10-31 MED ORDER — PHENYLEPHRINE HCL 10 MG/ML IJ SOLN
INTRAMUSCULAR | Status: AC
  Filled 2018-10-31: qty 1

## 2018-10-31 MED ORDER — PROPOFOL 10 MG/ML IV BOLUS
INTRAVENOUS | Status: AC
  Filled 2018-10-31: qty 20

## 2018-10-31 MED ORDER — LIDOCAINE 2% (20 MG/ML) 5 ML SYRINGE
INTRAMUSCULAR | Status: AC
  Filled 2018-10-31: qty 5

## 2018-10-31 MED ORDER — LIDOCAINE-EPINEPHRINE (PF) 1 %-1:200000 IJ SOLN
INTRAMUSCULAR | Status: DC | PRN
  Administered 2018-10-31: 10 mL

## 2018-10-31 MED ORDER — PROPOFOL 10 MG/ML IV BOLUS
INTRAVENOUS | Status: DC | PRN
  Administered 2018-10-31: 150 mg via INTRAVENOUS

## 2018-10-31 MED ORDER — PROMETHAZINE HCL 25 MG/ML IJ SOLN
6.2500 mg | INTRAMUSCULAR | Status: DC | PRN
  Filled 2018-10-31: qty 1

## 2018-10-31 MED ORDER — KETOROLAC TROMETHAMINE 30 MG/ML IJ SOLN
INTRAMUSCULAR | Status: AC
  Filled 2018-10-31: qty 1

## 2018-10-31 MED ORDER — OXYCODONE HCL 5 MG PO TABS
5.0000 mg | ORAL_TABLET | Freq: Once | ORAL | Status: DC | PRN
  Filled 2018-10-31: qty 1

## 2018-10-31 MED ORDER — FENTANYL CITRATE (PF) 250 MCG/5ML IJ SOLN
INTRAMUSCULAR | Status: AC
  Filled 2018-10-31: qty 5

## 2018-10-31 MED ORDER — FENTANYL CITRATE (PF) 100 MCG/2ML IJ SOLN
INTRAMUSCULAR | Status: DC | PRN
  Administered 2018-10-31 (×7): 25 ug via INTRAVENOUS
  Administered 2018-10-31: 50 ug via INTRAVENOUS
  Administered 2018-10-31: 25 ug via INTRAVENOUS

## 2018-10-31 MED ORDER — METOCLOPRAMIDE HCL 5 MG/ML IJ SOLN
INTRAMUSCULAR | Status: DC | PRN
  Administered 2018-10-31: 10 mg via INTRAVENOUS

## 2018-10-31 MED ORDER — FENTANYL CITRATE (PF) 100 MCG/2ML IJ SOLN
INTRAMUSCULAR | Status: AC
  Filled 2018-10-31: qty 2

## 2018-10-31 MED ORDER — ACETAMINOPHEN 325 MG PO TABS
650.0000 mg | ORAL_TABLET | ORAL | Status: DC | PRN
  Administered 2018-10-31: 650 mg via ORAL
  Filled 2018-10-31: qty 2

## 2018-10-31 MED ORDER — HYDROMORPHONE HCL 1 MG/ML IJ SOLN: 0.5000 mg | INTRAMUSCULAR | Status: DC | PRN

## 2018-10-31 MED ORDER — SCOPOLAMINE 1 MG/3DAYS TD PT72
MEDICATED_PATCH | TRANSDERMAL | Status: AC
  Filled 2018-10-31: qty 1

## 2018-10-31 MED ORDER — ROCURONIUM BROMIDE 10 MG/ML (PF) SYRINGE
PREFILLED_SYRINGE | INTRAVENOUS | Status: AC
  Filled 2018-10-31: qty 10

## 2018-10-31 MED ORDER — LACTATED RINGERS IV SOLN: INTRAVENOUS | Status: DC

## 2018-10-31 MED ORDER — MIDAZOLAM HCL 2 MG/2ML IJ SOLN
INTRAMUSCULAR | Status: AC
  Filled 2018-10-31: qty 2

## 2018-10-31 MED ORDER — OXYCODONE HCL 5 MG PO TABS
5.0000 mg | ORAL_TABLET | ORAL | Status: DC | PRN
  Administered 2018-10-31: 5 mg via ORAL
  Filled 2018-10-31: qty 1

## 2018-10-31 MED ORDER — SUGAMMADEX SODIUM 200 MG/2ML IV SOLN
INTRAVENOUS | Status: AC
  Filled 2018-10-31: qty 2

## 2018-10-31 MED ORDER — METOCLOPRAMIDE HCL 5 MG/ML IJ SOLN
INTRAMUSCULAR | Status: AC
  Filled 2018-10-31: qty 2

## 2018-10-31 MED ORDER — ONDANSETRON HCL 4 MG/2ML IJ SOLN
INTRAMUSCULAR | Status: DC | PRN
  Administered 2018-10-31: 4 mg via INTRAVENOUS

## 2018-10-31 MED ORDER — ROCURONIUM BROMIDE 100 MG/10ML IV SOLN
INTRAVENOUS | Status: DC | PRN
  Administered 2018-10-31: 50 mg via INTRAVENOUS

## 2018-10-31 MED ORDER — GLYCOPYRROLATE 0.2 MG/ML IJ SOLN
INTRAMUSCULAR | Status: DC | PRN
  Administered 2018-10-31: 0.2 mg via INTRAVENOUS

## 2018-10-31 MED ORDER — KETOROLAC TROMETHAMINE 30 MG/ML IJ SOLN
INTRAMUSCULAR | Status: DC | PRN
  Administered 2018-10-31: 30 mg via INTRAVENOUS

## 2018-10-31 SURGICAL SUPPLY — 54 items
ADH SKN CLS APL DERMABOND .7 (GAUZE/BANDAGES/DRESSINGS) ×1
APL SRG 38 LTWT LNG FL B (MISCELLANEOUS) ×1
APPLICATOR ARISTA FLEXITIP XL (MISCELLANEOUS) ×2 IMPLANT
APPLICATOR COTTON TIP 6IN STRL (MISCELLANEOUS) ×3 IMPLANT
BARRIER ADHS 3X4 INTERCEED (GAUZE/BANDAGES/DRESSINGS) IMPLANT
BRR ADH 4X3 ABS CNTRL BYND (GAUZE/BANDAGES/DRESSINGS)
CABLE HIGH FREQUENCY MONO STRZ (ELECTRODE) IMPLANT
COVER BACK TABLE 60X90IN (DRAPES) ×3 IMPLANT
COVER MAYO STAND STRL (DRAPES) ×6 IMPLANT
COVER WAND RF STERILE (DRAPES) ×3 IMPLANT
DERMABOND ADVANCED (GAUZE/BANDAGES/DRESSINGS) ×2
DERMABOND ADVANCED .7 DNX12 (GAUZE/BANDAGES/DRESSINGS) ×1 IMPLANT
DRSG COVADERM PLUS 2X2 (GAUZE/BANDAGES/DRESSINGS) IMPLANT
DRSG OPSITE POSTOP 3X4 (GAUZE/BANDAGES/DRESSINGS) IMPLANT
DURAPREP 26ML APPLICATOR (WOUND CARE) ×3 IMPLANT
ELECT REM PT RETURN 9FT ADLT (ELECTROSURGICAL) ×3
ELECTRODE REM PT RTRN 9FT ADLT (ELECTROSURGICAL) ×1 IMPLANT
GAUZE 4X4 16PLY RFD (DISPOSABLE) ×3 IMPLANT
GAUZE PACKING IODOFORM 1X5 (MISCELLANEOUS) IMPLANT
GLOVE BIO SURGEON STRL SZ 6.5 (GLOVE) ×4 IMPLANT
GLOVE BIO SURGEONS STRL SZ 6.5 (GLOVE) ×2
GLOVE BIOGEL PI IND STRL 7.0 (GLOVE) ×2 IMPLANT
GLOVE BIOGEL PI INDICATOR 7.0 (GLOVE) ×4
GLOVE LITE  25/BX (GLOVE) IMPLANT
GOWN STRL REUS W/TWL LRG LVL3 (GOWN DISPOSABLE) ×18 IMPLANT
GOWN STRL REUS W/TWL XL LVL3 (GOWN DISPOSABLE) ×6 IMPLANT
HEMOSTAT ARISTA ABSORB 3G PWDR (MISCELLANEOUS) ×2 IMPLANT
NDL MAYO CATGUT SZ4 TPR NDL (NEEDLE) IMPLANT
NEEDLE MAYO CATGUT SZ4 (NEEDLE) IMPLANT
PACK LAVH (CUSTOM PROCEDURE TRAY) ×3 IMPLANT
PACK TRENDGUARD 450 HYBRID PRO (MISCELLANEOUS) IMPLANT
PROTECTOR NERVE ULNAR (MISCELLANEOUS) ×6 IMPLANT
SCISSORS LAP 5X35 DISP (ENDOMECHANICALS) IMPLANT
SEALER TISSUE G2 CVD JAW 35 (ENDOMECHANICALS) IMPLANT
SEALER TISSUE G2 CVD JAW 45CM (ENDOMECHANICALS)
SET IRRIG TUBING LAPAROSCOPIC (IRRIGATION / IRRIGATOR) ×3 IMPLANT
SHEARS HARMONIC ACE PLUS 36CM (ENDOMECHANICALS) ×3 IMPLANT
SOLUTION ELECTROLUBE (MISCELLANEOUS) IMPLANT
SUT CHROMIC 0 CT 1 (SUTURE) IMPLANT
SUT MNCRL AB 4-0 PS2 18 (SUTURE) ×6 IMPLANT
SUT VIC AB 0 CT1 18XCR BRD8 (SUTURE) ×2 IMPLANT
SUT VIC AB 0 CT1 27 (SUTURE) ×9
SUT VIC AB 0 CT1 27XBRD ANBCTR (SUTURE) ×3 IMPLANT
SUT VIC AB 0 CT1 8-18 (SUTURE) ×6
SUT VICRYL 0 TIES 12 18 (SUTURE) ×3 IMPLANT
SUT VICRYL 0 UR6 27IN ABS (SUTURE) ×3 IMPLANT
SYR BULB IRRIGATION 50ML (SYRINGE) ×2 IMPLANT
TOWEL OR 17X24 6PK STRL BLUE (TOWEL DISPOSABLE) ×6 IMPLANT
TRAY FOLEY W/BAG SLVR 14FR (SET/KITS/TRAYS/PACK) ×3 IMPLANT
TRENDGUARD 450 HYBRID PRO PACK (MISCELLANEOUS) ×3
TROCAR BALLN 12MMX100 BLUNT (TROCAR) ×3 IMPLANT
TROCAR XCEL NON-BLD 5MMX100MML (ENDOMECHANICALS) ×6 IMPLANT
TUBING INSUF HEATED (TUBING) ×3 IMPLANT
WARMER LAPAROSCOPE (MISCELLANEOUS) ×3 IMPLANT

## 2018-10-31 NOTE — Anesthesia Postprocedure Evaluation (Signed)
Anesthesia Post Note  Patient: Carla Velez  Procedure(s) Performed: LAPAROSCOPIC ASSISTED VAGINAL HYSTERECTOMY WITH SALPINGECTOMY (Bilateral Abdomen)     Anesthesia Post Evaluation  Last Vitals:  Vitals:   10/31/18 1237 10/31/18 1240  BP:  (!) 88/55  Pulse:    Resp: 10 17  Temp:    SpO2:      Last Pain:  Vitals:   10/31/18 0829  TempSrc:   PainSc: 0-No pain                 Darcia Lampi

## 2018-10-31 NOTE — Anesthesia Postprocedure Evaluation (Signed)
Anesthesia Post Note  Patient: Carla Velez  Procedure(s) Performed: LAPAROSCOPIC ASSISTED VAGINAL HYSTERECTOMY WITH SALPINGECTOMY (Bilateral Abdomen)     Patient location during evaluation: PACU Anesthesia Type: General Level of consciousness: awake and alert Pain management: pain level controlled Vital Signs Assessment: post-procedure vital signs reviewed and stable Respiratory status: spontaneous breathing, nonlabored ventilation and respiratory function stable Cardiovascular status: blood pressure returned to baseline and stable Postop Assessment: no apparent nausea or vomiting Anesthetic complications: no    Last Vitals:  Vitals:   10/31/18 1245 10/31/18 1300  BP: (!) 86/55 96/64  Pulse: 63 66  Resp: 18 14  Temp:    SpO2: 98% 100%    Last Pain:  Vitals:   10/31/18 1300  TempSrc:   PainSc: [place]

## 2018-10-31 NOTE — Progress Notes (Signed)
Rn called to room, patient stated she felt she needed to urinate badly but was only able to void 50cc with no relief. Bladder scan only showed 25cc of urine. Dr. Phineas Real called, orders received for I&O cath. If second I&O cath needed foley should be left in overnight.First I&O cath volume was 250. Patient stated she felt relived after I&O cath, less felling of fullness and pressure.

## 2018-10-31 NOTE — H&P (Signed)
Carla Velez is an 42 y.o. 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  Patho benign.  Heavy bleeding since then with anemia.  Received IV Iron early 08/2018.  H/O Rt Breast Ca ER/PR positive, therefore cannot control the menorrhagia with hormonal treatment.    Pertinent Gynecological History: Menses: flow is excessive with use of many pads or tampons on heaviest days Contraception: vasectomy Blood transfusions: none Sexually transmitted diseases: no past history Previous GYN Procedures: HSC/Resection of Myoma/D+C  Last mammogram: normal  Last pap: normal  OB History: G2P2   Menstrual History: Patient's last menstrual period was 10/12/2018.    Past Surgical History:  Procedure Laterality Date  . BREAST CYST ASPIRATION    . BREAST IMPLANT REMOVAL Right 11/21/2017   Excision of wound  . BREAST LUMPECTOMY WITH RADIOACTIVE SEED AND SENTINEL LYMPH NODE BIOPSY Right 04/29/2017   Procedure: BREAST LUMPECTOMY WITH RADIOACTIVE SEED AND SENTINEL LYMPH NODE BIOPSY;  Surgeon: Erroll Luna, MD;  Location: [place];  Service: General;  Laterality: Right;  . DILATATION & CURETTAGE/HYSTEROSCOPY WITH MYOSURE N/A 05/17/2018   Procedure: DILATATION & CURETTAGE/HYSTEROSCOPY WITH MYOSURE;  Surgeon: [name], MD;  Location: [place];  Service: Gynecology;  Laterality: N/A;  . DILATION AND CURETTAGE OF UTERUS    . HYSTEROSCOPY  09/21/2018  . HYSTEROSCOPY WITH NOVASURE N/A 05/17/2018   Procedure: ATTEMPTED HYSTEROSCOPY WITH NOVASURE;  Surgeon: [name], MD;  Location: [place];  Service: Gynecology;  Laterality: N/A;  . MASTECTOMY Bilateral 10/07/2017  . RE-EXCISION OF BREAST LUMPECTOMY Right 05/10/2017   Procedure: RE-EXCISION OF BREAST LUMPECTOMY;   Surgeon: Erroll Luna, MD;  Location: [place];  Service: General;  Laterality: Right;  . TISSUE EXPANDER PLACEMENT  10/20/2017  . TISSUE EXPANDER PLACEMENT Right 02/2018  . TONSILLECTOMY      Family History  Problem Relation Age of Onset  . Breast cancer Mother 35  . Heart disease Father   . Kidney failure Father   . Macrosomia Maternal Grandmother   . Macular degeneration Maternal Grandmother   . Arthritis Maternal Grandmother   . Heart disease Maternal Grandfather   . Kidney disease Paternal Grandfather     Social History:  reports that she has never smoked. She has never used smokeless tobacco. She reports that she drinks alcohol. She reports that she does not use drugs.  Allergies:  Allergies  Allergen Reactions  . Chloraprep One Step [Chlorhexidine Gluconate] Rash  . Erythromycin Hives  . Bactrim [Sulfamethoxazole-Trimethoprim] Other (See Comments)    Joint pain and stiffness  . Piperacillin Sod-Tazobactam So     ZOSYN  . Silicone Hives    plugs  . Sulfa Antibiotics     Medications Prior to Admission  Medication Sig Dispense Refill Last Dose  . Multiple Vitamin (MULTIVITAMIN) capsule Take 1 capsule by mouth daily.   10/30/2018 at Unknown time    REVIEW OF SYSTEMS: A ROS was performed and pertinent positives and negatives are included in the history.  GENERAL: No fevers or chills. HEENT: No change in vision, no earache, sore throat or sinus congestion. NECK: No pain or stiffness. CARDIOVASCULAR: No chest pain or pressure. No palpitations. PULMONARY: No shortness of breath, cough or wheeze. GASTROINTESTINAL: No abdominal pain, nausea, vomiting or diarrhea, melena or bright red blood per rectum. GENITOURINARY: No urinary frequency, urgency, hesitancy or  dysuria. MUSCULOSKELETAL: No joint or muscle pain, no back pain, no recent trauma. DERMATOLOGIC: No rash, no itching, no lesions. ENDOCRINE: No polyuria, polydipsia, no heat or cold intolerance. No recent  change in weight. HEMATOLOGICAL: No anemia or easy bruising or bleeding. NEUROLOGIC: No headache, seizures, numbness, tingling or weakness. PSYCHIATRIC: No depression, no loss of interest in normal activity or change in sleep pattern.     Blood pressure 107/66, pulse 68, temperature 99.1 F (37.3 C), temperature source Oral, resp. rate 16, height 5\' 6"  (1.676 m), weight 63.7 kg, last menstrual period 10/12/2018, SpO2 100 %.  Physical Exam:  See office notes   Results for orders placed or performed during the hospital encounter of 10/31/18 (from the past 24 hour(s))  Pregnancy, urine POC     Status: None   Collection Time: 10/31/18  8:16 AM  Result Value Ref Range   Preg Test, Ur NEGATIVE NEGATIVE    No results found.   Sono Infusion Hysterogram ( procedure note)  The initial transvaginal ultrasound demonstrated the following:  See pelvic ultrasound findings on August 25, 2018.  The speculum  was inserted and the cervix cleansed with Betadine solution after confirming that patient has no allergies.A small sonohysterography catheterwas utilized.  Insertion was facilitated with ring forceps, using a spear-like motion the catheter was inserted to the fundus of the uterus. The speculum is then removed carefully to avoid dislodging the catheter. The catheter was flushed with sterile saline delete prior to insertion to rid it of small amounts of air.the sterile saline solution was infused into the uterine cavity as a vaginal ultrasound probe was then placed in the vagina for full visualization of the uterine cavity from a transvaginal approach. The following was noted:  Following injection of saline the endometrial cavity is filled with an anterior intramural/submucosal fibroid measuring 3.9 x 3.7 x 3.1 cm is seen, about 30% of the fibroid is submucosal.  A smaller intramural posterior fibroid is present.  The catheter was then removed after retrieving some of the saline from the  intrauterine cavity. An endometrial biopsy was not done. Patient tolerated procedure well. She had received a tablet of Aleve for discomfort.    Assessment/Plan:  42 y.o. [id number]   1. Menorrhagia with regular cycle Menorrhagia with thickened diarrhea and anemia persisting after partial resection of the submucosal/intramural fibroid.  Unsuccessful endometrial ablation.  Sonohysterogram today revealing a 3.9 x 3.7 x 3.1 cm fibroid with a 30% submucosal portion.  A smaller intramural posterior fibroid is also present.  Decision to proceed with hysterectomy.  After discussing the different approaches, patient opted for an LAVH/Bilateral Salpingectomy.  Surgery, risks and benefits thoroughly reviewed with patient.  Pamphlet given.   2. Fibroids As above.  3. Iron deficiency anemia due to chronic blood loss IV iron received through hematologist early October 2019.                        Patient was counseled as to the risk of surgery to include the following:  1. Infection (prohylactic antibiotics will be administered)  2. DVT/Pulmonary Embolism (prophylactic pneumo compression stockings will be used)  3.Trauma to internal organs requiring additional surgical procedure to repair any injury to internal organs requiring perhaps additional hospitalization days.  4.Hemmorhage requiring transfusion and blood products which carry risks such as   anaphylactic reaction, hepatitis and AIDS  Patient had received literature information on the procedure scheduled and all her questions were answered and fully accepts  all risk.  [place] Akhil Piscopo 10/31/2018, 9:50 AM

## 2018-10-31 NOTE — Anesthesia Procedure Notes (Signed)
Procedure Name: Intubation Date/Time: 10/31/2018 10:50 AM Performed by: Justice Rocher, CRNA Pre-anesthesia Checklist: Patient identified, Emergency Drugs available, Suction available and Patient being monitored Patient Re-evaluated:Patient Re-evaluated prior to induction Oxygen Delivery Method: Circle system utilized Preoxygenation: Pre-oxygenation with 100% oxygen Induction Type: IV induction Ventilation: Mask ventilation without difficulty Laryngoscope Size: Mac and 3 Grade View: Grade II Tube type: Oral Tube size: 7.0 mm Number of attempts: 1 Airway Equipment and Method: Stylet and Oral airway Placement Confirmation: ETT inserted through vocal cords under direct vision,  positive ETCO2 and breath sounds checked- equal and bilateral Secured at: 22 cm Tube secured with: Tape Dental Injury: Teeth and Oropharynx as per pre-operative assessment

## 2018-10-31 NOTE — Anesthesia Preprocedure Evaluation (Addendum)
Anesthesia Evaluation  Patient identified by MRN, date of birth, ID band Patient awake    Reviewed: Allergy & Precautions, NPO status , Patient's Chart, lab work & pertinent test results  History of Anesthesia Complications (+) PONV and history of anesthetic complications  Airway Mallampati: I  TM Distance: >3 FB Neck ROM: Full    Dental  (+) Dental Advisory Given, Teeth Intact   Pulmonary neg pulmonary ROS,    breath sounds clear to auscultation       Cardiovascular negative cardio ROS   Rhythm:Regular Rate:Normal     Neuro/Psych  Headaches, PSYCHIATRIC DISORDERS Anxiety  Neuromuscular disease (numbness under right arm)    GI/Hepatic negative GI ROS, Neg liver ROS,   Endo/Other  negative endocrine ROS  Renal/GU  Hx kidney stones      Musculoskeletal negative musculoskeletal ROS (+)   Abdominal   Peds  Hematology negative hematology ROS (+)   Anesthesia Other Findings   Reproductive/Obstetrics  Breast cancer                             Anesthesia Physical Anesthesia Plan  ASA: II  Anesthesia Plan: General   Post-op Pain Management:    Induction: Intravenous  PONV Risk Score and Plan: 4 or greater and Treatment may vary due to age or medical condition, Ondansetron, Scopolamine patch - Pre-op, Propofol infusion, Dexamethasone and Midazolam  Airway Management Planned: Oral ETT  Additional Equipment: None  Intra-op Plan:   Post-operative Plan: Extubation in OR  Informed Consent: I have reviewed the patients History and Physical, chart, labs and discussed the procedure including the risks, benefits and alternatives for the proposed anesthesia with the patient or authorized representative who has indicated his/her understanding and acceptance.   Dental advisory given  Plan Discussed with: CRNA and Anesthesiologist  Anesthesia Plan Comments:        Anesthesia Quick  Evaluation

## 2018-10-31 NOTE — Transfer of Care (Signed)
Immediate Anesthesia Transfer of Care Note  Patient: Carla Velez  Procedure(s) Performed: Procedure(s) (LRB): LAPAROSCOPIC ASSISTED VAGINAL HYSTERECTOMY WITH SALPINGECTOMY (Bilateral)  Patient Location: PACU  Anesthesia Type: General  Level of Consciousness: awake, sedated, patient cooperative and responds to stimulation  Airway & Oxygen Therapy: Patient Spontanous Breathing and Patient connected to [place] oxygen  Post-op Assessment: Report given to PACU RN, Post -op Vital signs reviewed and stable and Patient moving all extremities  Post vital signs: Reviewed and stable  Complications: No apparent anesthesia complications

## 2018-10-31 NOTE — Op Note (Signed)
Operative Note  10/31/2018  12:34 PM  PATIENT:  Carla Velez  42 y.o. female  PRE-OPERATIVE DIAGNOSIS:  Menometrorrhagia, fibroids, anemia  POST-OPERATIVE DIAGNOSIS:  Menometrorrhagia, fibroids, anemia,  Mild pelvic endometriosis, adhesions between bowels and left ovary.  PROCEDURE:  Procedure(s): LAPAROSCOPIC ASSISTED VAGINAL HYSTERECTOMY WITH BILATERAL SALPINGECTOMY  SURGEON:  Surgeon(s): [name], MD Fontaine, Belinda Block, MD  ANESTHESIA:   general  FINDINGS: Uterus with fibroids, normal tubes and ovaries.  Mild pelvic Endometriosis.  Adhesions between bowels and left ovary.  DESCRIPTION OF OPERATION: Under general anesthesia with endotracheal intubation the patient is in lithotomy position.  She is prepped with DuraPrep on the abdomen and with Betadine on the suprapubic, vulvar and vaginal areas.  Timeout is done.  The Foley catheter is put in place in the bladder.  The speculum is inserted in the vagina.  A 1 tooth uterine cannula is put in place.  The speculum is removed.  We go to the abdomen.      The infraumbilical area is infiltrated with Marcaine one quarter plain.  A 1.5 cm incision is done at that level with a scalpel.  The aponeurosis is grasped with Coker's.  The aponeurosis is open with Mayo scissors.  The parietal peritoneum is opened bluntly with the finger.  A pursestring stitch of Vicryl 0 was done on the aponeurosis.  The Hossein is inserted under direct vision.  Pneumoperitoneum is created with CO2.  The camera is inserted.  No adhesion is present with the anterior wall of the abdomen.  We make 5 mm incisions with a scalpel on either side of the lower abdomen.  5 mm ports are inserted under direct vision on either side.  Inspection of the abdominal pelvic cavities.  The liver and gallbladder are normal, the appendix is normal.  The uterus presents a fibroid.  Both tubes and both ovaries are normal.  Bowel adhesions are present at the left ovary.  Mild pelvic  endometriosis is present.  Pictures are taken of all the structures described in the abdomen and pelvis.  Both ureters are seen with good peristalsis and normal anatomy position.  We start on the right side, cauterizing and sectioning the right mesosalpinx.  We then cauterized and sectioned the right utero-ovarian ligament.  We cauterized and sectioned the right round ligament.  We opened the visceral peritoneum anteriorly and retracted the bladder down.  The bowel adhesions were distended away from the left ovary.  Otherwise we proceeded exactly the same way on the left side.  Hemostasis was adequate on all pedicles.  We went to vaginal time.      The patient was repositioned for vaginal surgery.  The weighted speculum was inserted in the vagina.  The cervix was grasped with 2 Jacob clamps after removing the uterine cannula.  We infiltrated the junction between the cervix and vagina with lidocaine 1% with epinephrine.  We then used the Bovie coag mode to make a circumferential opening between the cervix and vagina.  The visceral peritoneum was opened anteriorly and the retractor inserted at that level.  We then opened the visceral peritoneum posteriorly and inserted the weighted speculum at that level.  We started on the left side with the curved Heaney clamping, sectioning with Mayo scissors and suturing with a Vicryl 0.  That stitch was A Curved Hemostat.  We Then Clamped the Left Uterosacral Ligament with the Curved Heaney, Section with Mayo Scissors and Sutured with a Vicryl 0.  That Stitch Was Kept on the  Same Curved Hemostat.  We Went on the Right Side and Proceeded the Same Way.  We Then Clamped the Right Uterine Artery with a Curved Heaney, section with Mayo scissors and sutured with a Vicryl 0.  We then clamped the left uterine artery section with a curved Heaney and sutured with a Vicryl 0.  We put 1 more clamp on each side to section with Mayo scissors and sutured with a Vicryl 0.  We then started  coring the uterus to decrease the bulk with the scalpel.  We were able to then clamp the very small pedicle left on each side with curved Heaneys section with Mayo scissors and suture with a Vicryl 0.  The specimen was sent to pathology.  Hemostasis was verified and adequate at all pedicles.  We then closed the vaginal vault by first putting a locked running suture of Vicryl 0 at the posterior vagina.  We then closed with figure-of-eight's of Vicryl 0 including the peritoneum.  Hemostasis was good at all levels.  We removed all vaginal instruments.  We went to laparoscopy.  We confirmed good hemostasis at all levels.  Arista was sprayed for hemostasis.  Pictures were taken before and after the procedure.  All laparoscopic instruments were removed under direct vision.  The ports were removed under direct vision.  The CO2 was evacuated.  The infraumbilical incision was closed by attaching the pursestring stitch at the aponeurosis.  The skin incisions were closed with separate stitches of Monocryl 4-0.  Dermabond was added on the 3 incisions.  The patient was brought to recovery room in good and stable status.  ESTIMATED BLOOD LOSS: 50 mL   Intake/Output Summary (Last 24 hours) at 10/31/2018 1234 Last data filed at 10/31/2018 1223 Gross per 24 hour  Intake 1000 ml  Output 500 ml  Net 500 ml     BLOOD ADMINISTERED:none   LOCAL MEDICATIONS USED:  MARCAINE     SPECIMEN:  Source of Specimen:  Uterus with cervix and bilateral tubes  DISPOSITION OF SPECIMEN:  PATHOLOGY  COUNTS:  YES  PLAN OF CARE: Transfer to PACU  Marie-Lyne LavoieMD12:34 PM

## 2018-11-01 ENCOUNTER — Encounter (HOSPITAL_BASED_OUTPATIENT_CLINIC_OR_DEPARTMENT_OTHER): Payer: Self-pay | Admitting: Obstetrics & Gynecology

## 2018-11-01 DIAGNOSIS — N800 Endometriosis of uterus: Secondary | ICD-10-CM | POA: Diagnosis not present

## 2018-11-01 LAB — CBC
HCT: 36.6 % (ref 36.0–46.0)
HEMOGLOBIN: 11.6 g/dL — AB (ref 12.0–15.0)
MCH: 29.4 pg (ref 26.0–34.0)
MCHC: 31.7 g/dL (ref 30.0–36.0)
MCV: 92.9 fL (ref 80.0–100.0)
Platelets: 150 10*3/uL (ref 150–400)
RBC: 3.94 MIL/uL (ref 3.87–5.11)
RDW: 17.1 % — ABNORMAL HIGH (ref 11.5–15.5)
WBC: 9.6 10*3/uL (ref 4.0–10.5)
nRBC: 0 % (ref 0.0–0.2)

## 2018-11-01 MED ORDER — OXYCODONE-ACETAMINOPHEN 7.5-325 MG PO TABS: 1.0000 | ORAL_TABLET | Freq: Four times a day (QID) | ORAL | 0 refills | Status: DC | PRN

## 2018-11-01 NOTE — Discharge Instructions (Signed)
Laparoscopically Assisted Vaginal Hysterectomy, Care After Refer to this sheet in the next few weeks. These instructions provide you with information on caring for yourself after your procedure. Your health care provider may also give you more specific instructions. Your treatment has been planned according to current medical practices, but problems sometimes occur. Call your health care provider if you have any problems or questions after your procedure. What can I expect after the procedure? After your procedure, it is typical to have the following:  Abdominal pain. You will be given pain medicine to control it.  Sore throat from the breathing tube that was inserted during surgery.  Follow these instructions at home:  Only take over-the-counter or prescription medicines for pain, discomfort, or fever as directed by your health care provider.  Do not take aspirin. It can cause bleeding.  Do not drive when taking pain medicine.  Follow your health care provider's advice regarding diet, exercise, lifting, driving, and general activities.  Resume your usual diet as directed and allowed.  Get plenty of rest and sleep.  Do not douche, use tampons, or have sexual intercourse for at least 6 weeks, or until your health care provider gives you permission.  Change your bandages (dressings) as directed by your health care provider.  Monitor your temperature and notify your health care provider of a fever.  Take showers instead of baths for 2-3 weeks.  Do not drink alcohol until your health care provider gives you permission.  If you develop constipation, you may take a mild laxative with your health care provider's permission. Bran foods may help with constipation problems. Drinking enough fluids to keep your urine clear or pale yellow may help as well.  Try to have someone home with you for 1-2 weeks to help around the house.  Keep all of your follow-up appointments as directed by your  health care provider. Contact a health care provider if:  You have swelling, redness, or increasing pain around your incision sites.  You have pus coming from your incision.  You notice a bad smell coming from your incision.  Your incision breaks open.  You feel dizzy or lightheaded.  You have pain or bleeding when you urinate.  You have persistent diarrhea.  You have persistent nausea and vomiting.  You have abnormal vaginal discharge.  You have a rash.  You have any type of abnormal reaction or develop an allergy to your medicine.  You have poor pain control with your prescribed medicine. Get help right away if:  You have a fever.  You have severe abdominal pain.  You have chest pain.  You have shortness of breath.  You faint.  You have pain, swelling, or redness in your leg.  You have heavy vaginal bleeding with blood clots. This information is not intended to replace advice given to you by your health care provider. Make sure you discuss any questions you have with your health care provider. Document Released: 10/29/2011 Document Revised: 04/16/2016 Document Reviewed: 05/25/2013 Elsevier Interactive Patient Education  2017 [employer].

## 2018-11-01 NOTE — Progress Notes (Signed)
POD #1 LAVH/Bilateral Salpingectomy  Subjective: Patient reports tolerating PO.    Objective: I have reviewed patient's vital signs.  vital signs, intake and output, medications and labs.  Vitals:   10/31/18 2315 11/01/18 0330  BP: (!) 95/55 103/73  Pulse: 64 73  Resp: 16 14  Temp: 98.9 F (37.2 C) 99 F (37.2 C)  SpO2: 99% 99%   I/O last 3 completed shifts: In: 3688 [P.O.:988; I.V.:2700] Out: 1575 [Urine:1525; Blood:50] No intake/output data recorded.  Results for orders placed or performed during the hospital encounter of 10/31/18 (from the past 24 hour(s))  Pregnancy, urine POC     Status: None   Collection Time: 10/31/18  8:16 AM  Result Value Ref Range   Preg Test, Ur NEGATIVE NEGATIVE  CBC     Status: Abnormal   Collection Time: 11/01/18  6:40 AM  Result Value Ref Range   WBC 9.6 4.0 - 10.5 K/uL   RBC 3.94 3.87 - 5.11 MIL/uL   Hemoglobin 11.6 (L) 12.0 - 15.0 g/dL   HCT 36.6 36.0 - 46.0 %   MCV 92.9 80.0 - 100.0 fL   MCH 29.4 26.0 - 34.0 pg   MCHC 31.7 30.0 - 36.0 g/dL   RDW 17.1 (H) 11.5 - 15.5 %   Platelets 150 150 - 400 K/uL   nRBC 0.0 0.0 - 0.2 %    EXAM General: alert and cooperative Resp: clear to auscultation bilaterally Cardio: regular rate and rhythm GI: normal findings: bowel sounds normal and incision: clean, dry and intact Extremities: no edema, redness or tenderness in the calves or thighs Vaginal Bleeding: none  Assessment: s/p Procedure(s): LAPAROSCOPIC ASSISTED VAGINAL HYSTERECTOMY WITH SALPINGECTOMY: stable, progressing well and tolerating diet  Plan: Discharge home  LOS: 0 days    [name], MD 11/01/2018 7:01 AM

## 2018-11-21 ENCOUNTER — Ambulatory Visit: Payer: BLUE CROSS/BLUE SHIELD | Admitting: Obstetrics & Gynecology

## 2018-11-24 ENCOUNTER — Ambulatory Visit (INDEPENDENT_AMBULATORY_CARE_PROVIDER_SITE_OTHER): Payer: BLUE CROSS/BLUE SHIELD | Admitting: Obstetrics & Gynecology

## 2018-11-24 ENCOUNTER — Encounter: Payer: Self-pay | Admitting: Obstetrics & Gynecology

## 2018-11-24 VITALS — BP 120/78

## 2018-11-24 DIAGNOSIS — Z09 Encounter for follow-up examination after completed treatment for conditions other than malignant neoplasm: Secondary | ICD-10-CM

## 2018-11-24 NOTE — Patient Instructions (Signed)
1. Status post gynecological surgery, follow-up exam Good postop evolution with no complication.  Pathology benign showing adenomyosis and fibroids.  Endometriosis diagnosed at surgery.  Can resume physical activity progressively, recommend maximum lifting of 20 pounds until 6 weeks.  We will follow-up in 4 weeks to reassess the vaginal vault before sexual activities.  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  Precious Bard, good seeing you today!

## 2018-11-24 NOTE — Progress Notes (Signed)
    Carla Velez Jun 09, 1976 [id number]        43 y.o.  [id number] Married  RP: Postop LAVH/Bilateral Salpingectomy on 10/31/2018  HPI: Good postop evolution with no abdominal pelvic pain.  Vaginal spotting stopped a week ago.  Normal vaginal secretions.  Urine and bowel movements normal.  No fever.   OB History  Gravida Para Term Preterm AB Living  2 1   1 1 2   SAB TAB Ectopic Multiple Live Births      0        # Outcome Date GA Lbr Len/2nd Weight Sex Delivery Anes PTL Lv  2 Preterm           1 AB             Past medical history,surgical history, problem list, medications, allergies, family history and social history were all reviewed and documented in the EPIC chart.   Directed ROS with pertinent positives and negatives documented in the history of present illness/assessment and plan.  Exam:  Vitals:   11/24/18 1003  BP: 120/78   General appearance:  Normal  Abdomen: Incisions closed, intact, no erythema, non-tender, no drainage.  Gynecologic exam: Vulva normal.  Speculum:  Vaginal vault closed, no bleeding, normal secretions.  Patho:  Diagnosis Uterus, cervix and bilateral fallopian tubes CERVIX: - ACUTE AND CHRONIC CERVICITIS. UTERUS: - ENDOMETRIUM: SECRETORY ENDOMETRIUM. - MYOMETRIUM: ADENOMYOSIS. LEIOMYOMATA. - SEROSA: NO SIGNIFICANT HISTOPATHOLOGIC FINDINGS. ADNEXA: - FALLOPIAN TUBES: PARATUBAL CYSTS.   Assessment/Plan:  43 y.o. [id number]   1. Status post gynecological surgery, follow-up exam Good postop evolution with no complication.  Pathology benign showing adenomyosis and fibroids.  Endometriosis diagnosed at surgery.  Can resume physical activity progressively, recommend maximum lifting of 20 pounds until 6 weeks.  We will follow-up in 4 weeks to reassess the vaginal vault before sexual activities.  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  [name] MD, 10:05 AM 11/24/2018

## 2018-12-22 ENCOUNTER — Ambulatory Visit (INDEPENDENT_AMBULATORY_CARE_PROVIDER_SITE_OTHER): Payer: BLUE CROSS/BLUE SHIELD | Admitting: Obstetrics & Gynecology

## 2018-12-22 ENCOUNTER — Encounter: Payer: Self-pay | Admitting: Obstetrics & Gynecology

## 2018-12-22 VITALS — BP 120/72

## 2018-12-22 DIAGNOSIS — Z09 Encounter for follow-up examination after completed treatment for conditions other than malignant neoplasm: Secondary | ICD-10-CM

## 2018-12-22 NOTE — Progress Notes (Signed)
    Carla Velez [birth year] [id number]        43 y.o.  [id number]   RP: LAVH/Bilateral Salpingectomy on 10/31/2018  HPI: Doing very well with no abdominopelvic pain, no vaginal bleeding and normal secretions.  No fever.   OB History  Gravida Para Term Preterm AB Living  2 1   1 1 2   SAB TAB Ectopic Multiple Live Births      0        # Outcome Date GA Lbr Len/2nd Weight Sex Delivery Anes PTL Lv  2 Preterm           1 AB             Past medical history,surgical history, problem list, medications, allergies, family history and social history were all reviewed and documented in the EPIC chart.   Directed ROS with pertinent positives and negatives documented in the history of present illness/assessment and plan.  Exam:  Vitals:   12/22/18 1207  BP: 120/72   General appearance:  Normal  Abdomen: Normal.  Skin incisions closed, no erythema.  Gynecologic exam: Vulva normal.  Bimanual exam: Vaginal vault well closed and nontender.  No induration.  No pelvic mass felt.   Assessment/Plan:  43 y.o. [id number]   1. Status post gynecological surgery, follow-up exam Good postop evolution.  No complication.  Completely healed.  May resume all physical activities and sexual activities.    Follow-up annual gynecologic exam August 2020.   [name] MD, 12:35 PM 12/22/2018

## 2018-12-22 NOTE — Patient Instructions (Signed)
1. Status post gynecological surgery, follow-up exam Good postop evolution.  No complication.  Completely healed.  May resume all physical activities and sexual activities.    Follow-up annual gynecologic exam August 2020.  Precious Bard, good seeing you today!

## 2019-04-27 ENCOUNTER — Encounter: Payer: BLUE CROSS/BLUE SHIELD | Admitting: Obstetrics & Gynecology
